# Patient Record
Sex: Male | Born: 1990 | Race: White | Hispanic: No | Marital: Married | Smoking: Never smoker
Health system: Southern US, Community
[De-identification: ages and names within clinical notes are randomized; demographics above are authoritative.]

## PROBLEM LIST (undated history)

## (undated) DIAGNOSIS — F909 Attention-deficit hyperactivity disorder, unspecified type: Secondary | ICD-10-CM

## (undated) DIAGNOSIS — R480 Dyslexia and alexia: Secondary | ICD-10-CM

## (undated) DIAGNOSIS — M352 Behcet's disease: Secondary | ICD-10-CM

## (undated) HISTORY — PX: HAND SURGERY: SHX662

## (undated) HISTORY — DX: Behcet's disease: M35.2

---
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
## 2011-11-21 ENCOUNTER — Encounter (HOSPITAL_BASED_OUTPATIENT_CLINIC_OR_DEPARTMENT_OTHER): Payer: Self-pay | Admitting: *Deleted

## 2011-11-21 ENCOUNTER — Emergency Department (HOSPITAL_BASED_OUTPATIENT_CLINIC_OR_DEPARTMENT_OTHER)
Admission: EM | Admit: 2011-11-21 | Discharge: 2011-11-21 | Disposition: A | Discharge status: Home / Self Care | Payer: Self-pay | Attending: Emergency Medicine | Admitting: Emergency Medicine

## 2011-11-21 ENCOUNTER — Emergency Department (HOSPITAL_BASED_OUTPATIENT_CLINIC_OR_DEPARTMENT_OTHER): Payer: Self-pay

## 2011-11-21 DIAGNOSIS — F909 Attention-deficit hyperactivity disorder, unspecified type: Secondary | ICD-10-CM | POA: Insufficient documentation

## 2011-11-21 DIAGNOSIS — S9031XA Contusion of right foot, initial encounter: Secondary | ICD-10-CM

## 2011-11-21 DIAGNOSIS — S9030XA Contusion of unspecified foot, initial encounter: Secondary | ICD-10-CM | POA: Insufficient documentation

## 2011-11-21 DIAGNOSIS — IMO0001 Reserved for inherently not codable concepts without codable children: Secondary | ICD-10-CM | POA: Insufficient documentation

## 2011-11-21 DIAGNOSIS — R480 Dyslexia and alexia: Secondary | ICD-10-CM | POA: Insufficient documentation

## 2011-11-21 DIAGNOSIS — Y92009 Unspecified place in unspecified non-institutional (private) residence as the place of occurrence of the external cause: Secondary | ICD-10-CM | POA: Insufficient documentation

## 2011-11-21 DIAGNOSIS — Z881 Allergy status to other antibiotic agents status: Secondary | ICD-10-CM | POA: Insufficient documentation

## 2011-11-21 HISTORY — DX: Dyslexia and alexia: R48.0

## 2011-11-21 HISTORY — DX: Attention-deficit hyperactivity disorder, unspecified type: F90.9
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
## 2011-11-21 NOTE — Discharge Instructions (Signed)
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
## 2011-11-22 NOTE — ED Provider Notes (Signed)
Medical screening examination/treatment/procedure(s) were performed by non-physician practitioner and as supervising physician I was immediately available for consultation/collaboration.   Hanley Seamen, MD 11/22/11 [phone]

## 2011-11-30 ENCOUNTER — Encounter (HOSPITAL_BASED_OUTPATIENT_CLINIC_OR_DEPARTMENT_OTHER): Payer: Self-pay

## 2011-11-30 ENCOUNTER — Emergency Department (HOSPITAL_BASED_OUTPATIENT_CLINIC_OR_DEPARTMENT_OTHER)
Admission: EM | Admit: 2011-11-30 | Discharge: 2011-11-30 | Disposition: A | Discharge status: Home / Self Care | Payer: No Typology Code available for payment source | Attending: Emergency Medicine | Admitting: Emergency Medicine

## 2011-11-30 DIAGNOSIS — S139XXA Sprain of joints and ligaments of unspecified parts of neck, initial encounter: Secondary | ICD-10-CM | POA: Insufficient documentation

## 2011-11-30 DIAGNOSIS — F909 Attention-deficit hyperactivity disorder, unspecified type: Secondary | ICD-10-CM | POA: Insufficient documentation

## 2011-11-30 DIAGNOSIS — S161XXA Strain of muscle, fascia and tendon at neck level, initial encounter: Secondary | ICD-10-CM

## 2011-11-30 DIAGNOSIS — Y92410 Unspecified street and highway as the place of occurrence of the external cause: Secondary | ICD-10-CM | POA: Insufficient documentation
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
## 2011-11-30 NOTE — ED Notes (Signed)
MD at bedside. 

## 2014-09-14 ENCOUNTER — Emergency Department (HOSPITAL_COMMUNITY): Payer: PRIVATE HEALTH INSURANCE

## 2014-09-14 ENCOUNTER — Emergency Department (HOSPITAL_COMMUNITY)
Admission: EM | Admit: 2014-09-14 | Discharge: 2014-09-14 | Disposition: A | Discharge status: Home / Self Care | Payer: PRIVATE HEALTH INSURANCE | Attending: Emergency Medicine | Admitting: Emergency Medicine

## 2014-09-14 ENCOUNTER — Encounter (HOSPITAL_COMMUNITY): Payer: Self-pay | Admitting: Emergency Medicine

## 2014-09-14 DIAGNOSIS — W2107XA Struck by softball, initial encounter: Secondary | ICD-10-CM | POA: Insufficient documentation

## 2014-09-14 DIAGNOSIS — Y9364 Activity, baseball: Secondary | ICD-10-CM | POA: Insufficient documentation

## 2014-09-14 DIAGNOSIS — Z880 Allergy status to penicillin: Secondary | ICD-10-CM | POA: Diagnosis not present

## 2014-09-14 DIAGNOSIS — S4991XA Unspecified injury of right shoulder and upper arm, initial encounter: Secondary | ICD-10-CM | POA: Diagnosis present

## 2014-09-14 DIAGNOSIS — Y9289 Other specified places as the place of occurrence of the external cause: Secondary | ICD-10-CM | POA: Insufficient documentation

## 2014-09-14 DIAGNOSIS — Y998 Other external cause status: Secondary | ICD-10-CM | POA: Diagnosis not present

## 2014-09-14 DIAGNOSIS — S43401A Unspecified sprain of right shoulder joint, initial encounter: Secondary | ICD-10-CM | POA: Diagnosis not present

## 2014-09-14 DIAGNOSIS — Z8659 Personal history of other mental and behavioral disorders: Secondary | ICD-10-CM | POA: Insufficient documentation

## 2014-09-14 MED ORDER — OXYCODONE-ACETAMINOPHEN 5-325 MG PO TABS
1.0000 | ORAL_TABLET | Freq: Once | ORAL | Status: AC
  Administered 2014-09-14: 1 via ORAL
  Filled 2014-09-14: qty 1

## 2014-09-14 MED ORDER — IBUPROFEN 800 MG PO TABS: 800.0000 mg | ORAL_TABLET | Freq: Three times a day (TID) | ORAL | Status: DC

## 2014-09-14 NOTE — Discharge Instructions (Signed)
Shoulder Sprain °A shoulder sprain is the result of damage to the tough, fiber-like tissues (ligaments) that help hold your shoulder in place. The ligaments may be stretched or torn. Besides the main shoulder joint (the ball and socket), there are several smaller joints that connect the bones in this area. A sprain usually involves one of those joints. Most often it is the acromioclavicular (or AC) joint. That is the joint that connects the collarbone (clavicle) and the shoulder blade (scapula) at the top point of the shoulder blade (acromion). °A shoulder sprain is a mild form of what is called a shoulder separation. Recovering from a shoulder sprain may take some time. For some, pain lingers for several months. Most people recover without long term problems. °CAUSES  °· A shoulder sprain is usually caused by some kind of trauma. This might be: °¨ Falling on an outstretched arm. °¨ Being hit hard on the shoulder. °¨ Twisting the arm. °· Shoulder sprains are more likely to occur in people who: °¨ Play sports. °¨ Have balance or coordination problems. °SYMPTOMS  °· Pain when you move your shoulder. °· Limited ability to move the shoulder. °· Swelling and tenderness on top of the shoulder. °· Redness or warmth in the shoulder. °· Bruising. °· A change in the shape of the shoulder. °DIAGNOSIS  °Your healthcare provider may: °· Ask about your symptoms. °· Ask about recent activity that might have caused those symptoms. °· Examine your shoulder. You may be asked to do simple exercises to test movement. The other shoulder will be examined for comparison. °· Order some tests that provide a look inside the body. They can show the extent of the injury. The tests could include: °¨ X-rays. °¨ CT (computed tomography) scan. °¨ MRI (magnetic resonance imaging) scan. °RISKS AND COMPLICATIONS °· Loss of full shoulder motion. °· Ongoing shoulder pain. °TREATMENT  °How long it takes to recover from a shoulder sprain depends on how  severe it was. Treatment options may include: °· Rest. You should not use the arm or shoulder until it heals. °· Ice. For 2 or 3 days after the injury, put an ice pack on the shoulder up to 4 times a day. It should stay on for 15 to 20 minutes each time. Wrap the ice in a towel so it does not touch your skin. °· Over-the-counter medicine to relieve pain. °· A sling or brace. This will keep the arm still while the shoulder is healing. °· Physical therapy or rehabilitation exercises. These will help you regain strength and motion. Ask your healthcare provider when it is OK to begin these exercises. °· Surgery. The need for surgery is rare with a sprained shoulder, but some people may need surgery to keep the joint in place and reduce pain. °HOME CARE INSTRUCTIONS  °· Ask your healthcare provider about what you should and should not do while your shoulder heals. °· Make sure you know how to apply ice to the correct area of your shoulder. °· Talk with your healthcare provider about which medications should be used for pain and swelling. °· If rehabilitation therapy will be needed, ask your healthcare provider to refer you to a therapist. If it is not recommended, then ask about at-home exercises. Find out when exercise should begin. °SEEK MEDICAL CARE IF:  °Your pain, swelling, or redness at the joint increases. °SEEK IMMEDIATE MEDICAL CARE IF:  °· You have a fever. °· You cannot move your arm or shoulder. °Document Released: 10/03/2008 Document   Revised: 08/09/2011 Document Reviewed: 10/03/2008 °[employer]® Patient Information ©2015 [employer]. This information is not intended to replace advice given to you by your health care provider. Make sure you discuss any questions you have with your health care provider. ° °

## 2014-09-14 NOTE — ED Provider Notes (Signed)
CSN: [id number]     Arrival date & time 09/14/14  1814 History   First MD Initiated Contact with Patient 09/14/14 1831     Chief Complaint  Patient presents with  . Shoulder Injury     (Consider location/radiation/quality/duration/timing/severity/associated sxs/prior Treatment) HPI   24 year old male presents for evaluation of R shoulder injury. Patient reports approximately 2 hours ago he was playing softball, he went to catch the ball but ran into a fence, knock the fence down and landed directly on his right shoulder. He denies any loss of consciousness but complained of severe sharp pain to right shoulder, unable to move shoulder. Pain radiates to his right upper arm, persistent, worsening with movement. No associated numbness, no neck pain, elbow pain or wrist pain. Reported remote injury to the right shoulder in the past. Denies any other injury. No specific treatment tried prior to arrival.  Past Medical History  Diagnosis Date  . ADHD (attention deficit hyperactivity disorder)   . Dyslexia    History reviewed. No pertinent past surgical history. No family history on file. History  Substance Use Topics  . Smoking status: Never Smoker   . Smokeless tobacco: Not on file  . Alcohol Use: Yes    Review of Systems  Constitutional: Negative for fever.  Musculoskeletal: Positive for joint swelling.  Skin: Negative for rash and wound.  Neurological: Negative for numbness.      Allergies  Amoxicillin  Home Medications   Prior to Admission medications   Medication Sig Start Date End Date Taking? Authorizing Provider  ibuprofen (ADVIL,MOTRIN) 200 MG tablet Take 600 mg by mouth every 6 (six) hours as needed for moderate pain. Patient used this medication for his back pain.   Yes Historical Provider, MD  Pseudoephedrine-Ibuprofen (ADVIL COLD/SINUS PO) Take 1 tablet by mouth every 6 (six) hours as needed (cold symptoms and sinus pressure).   Yes Historical Provider, MD   BP  120/76 mmHg  Pulse 104  Temp(Src) 98.5 F (36.9 C) (Oral)  Resp 19  SpO2 98% Physical Exam  Constitutional: He appears well-developed and well-nourished. No distress.  HENT:  Head: Atraumatic.  Eyes: Conjunctivae are normal.  Neck: Normal range of motion. Neck supple.  Musculoskeletal: He exhibits tenderness (Right shoulder: [facility] with surrounding edema.  Decreased shoulder ROM due to pain.  no clavicular tenderness.  No gross deformity to shoulder.  normal R elbow and R wrist with intact radial pulse and sensation intact throughout).  No midline spine tenderness.  Neurological: He is alert.  Skin: No rash noted.  Psychiatric: He has a normal mood and affect.  Nursing note and vitals reviewed.   ED Course  Procedures (including critical care time)  7:08 PM Patient with mechanical injury to right shoulder, suspect dislocation vs sprain. X-ray ordered, pain medication given. Patient otherwise neurovascularly intact.  8:29 PM X-ray of right shoulder shows no acute fracture or dislocation. This is likely a shoulder sprain. Shoulder sling provided for stability and support. Rice therapy discussed. NSAIDs is provided. Orthopedic referral given as needed.  Labs Review Labs Reviewed - No data to display  Imaging Review Dg Shoulder Right  09/14/2014   CLINICAL DATA:  Right shoulder pain following a softball injury today.  EXAM: RIGHT SHOULDER - 2+ VIEW  COMPARISON:  None.  FINDINGS: Possible Hill-Sachs deformity in the right humeral head laterally. Otherwise, normal appearing bones and soft tissues. No acute fracture or dislocation seen.  IMPRESSION: Possible Hill-Sachs deformity.  Otherwise, normal examination.  Electronically Signed   By: Beckie Salts M.D.   On: 09/14/2014 19:37     EKG Interpretation None      MDM   Final diagnoses:  Shoulder sprain, right, initial encounter   BP 120/76 mmHg  Pulse 104  Temp(Src) 98.5 F (36.9 C) (Oral)  Resp 19   SpO2 98%  I have reviewed nursing notes and vital signs. I personally reviewed the imaging tests through PACS system  I reviewed available ER/hospitalization records thought the EMR     Fayrene Helper, PA-C 09/14/14 2030  Rolan Bucco, MD 09/15/14 [phone]

## 2014-09-14 NOTE — ED Notes (Signed)
Pt playing softball and fell on shoulder. Patient guarding shoulder. No obvious deformity. Patient able to wiggle fingers and good radial pulses.

## 2015-12-28 ENCOUNTER — Ambulatory Visit (HOSPITAL_COMMUNITY): Payer: Worker's Compensation

## 2015-12-28 ENCOUNTER — Ambulatory Visit (HOSPITAL_COMMUNITY)
Admission: EM | Admit: 2015-12-28 | Discharge: 2015-12-28 | Disposition: A | Discharge status: Home / Self Care | Payer: Worker's Compensation | Attending: Emergency Medicine | Admitting: Emergency Medicine

## 2015-12-28 ENCOUNTER — Encounter (HOSPITAL_COMMUNITY): Payer: Self-pay | Admitting: *Deleted

## 2015-12-28 DIAGNOSIS — X58XXXA Exposure to other specified factors, initial encounter: Secondary | ICD-10-CM | POA: Diagnosis not present

## 2015-12-28 DIAGNOSIS — S6992XA Unspecified injury of left wrist, hand and finger(s), initial encounter: Secondary | ICD-10-CM

## 2015-12-28 DIAGNOSIS — S6982XA Other specified injuries of left wrist, hand and finger(s), initial encounter: Secondary | ICD-10-CM | POA: Diagnosis not present

## 2015-12-28 DIAGNOSIS — Z23 Encounter for immunization: Secondary | ICD-10-CM

## 2015-12-28 MED ORDER — HYDROCODONE-ACETAMINOPHEN 5-325 MG PO TABS: 2.0000 | ORAL_TABLET | Freq: Once | ORAL | Status: DC

## 2015-12-28 MED ORDER — TETANUS-DIPHTH-ACELL PERTUSSIS 5-2.5-18.5 LF-MCG/0.5 IM SUSP
INTRAMUSCULAR | Status: AC
  Filled 2015-12-28: qty 0.5

## 2015-12-28 MED ORDER — MUPIROCIN 2 % EX OINT: 1.0000 "application " | TOPICAL_OINTMENT | Freq: Two times a day (BID) | CUTANEOUS | 0 refills | Status: DC

## 2015-12-28 MED ORDER — TETANUS-DIPHTHERIA TOXOIDS TD 5-2 LFU IM INJ
0.5000 mL | INJECTION | Freq: Once | INTRAMUSCULAR | Status: AC
  Administered 2015-12-28: 0.5 mL via INTRAMUSCULAR

## 2015-12-28 MED ORDER — HYDROCODONE-ACETAMINOPHEN 5-325 MG PO TABS: 2.0000 | ORAL_TABLET | ORAL | 0 refills | Status: DC | PRN

## 2015-12-28 MED ORDER — LIDOCAINE HCL (PF) 2 % IJ SOLN
INTRAMUSCULAR | Status: AC
  Filled 2015-12-28: qty 2

## 2015-12-28 MED ORDER — IBUPROFEN 800 MG PO TABS
ORAL_TABLET | ORAL | Status: AC
  Filled 2015-12-28: qty 1

## 2015-12-28 MED ORDER — IBUPROFEN 800 MG PO TABS
800.0000 mg | ORAL_TABLET | Freq: Once | ORAL | Status: AC
  Administered 2015-12-28: 800 mg via ORAL

## 2015-12-28 NOTE — ED Triage Notes (Signed)
Patient was at work yesterday, [employer], and the drill went into finger causing nail bed to be lifted. On exam mild bleeding noted to nail bed with bruising and swelling. Injury to left pointer finger.

## 2015-12-28 NOTE — ED Provider Notes (Signed)
CSN: [id number]     Arrival date & time 12/28/15  1206 History   First MD Initiated Contact with Patient 12/28/15 1303     Chief Complaint  Patient presents with  . Finger Injury  . Nail Problem   (Consider location/radiation/quality/duration/timing/severity/associated sxs/prior Treatment)  HPI   The patient is a 25 year old male presenting today with complaints of an injury to his left index finger yesterday morning at work. Patient states the drill accidentally went up underneath his nail. Patient is unsure of his tetanus status and states he is allergic to amoxicillin or penicillin. Denies significant medical history otherwise.  Past Medical History:  Diagnosis Date  . ADHD (attention deficit hyperactivity disorder)   . Dyslexia    History reviewed. No pertinent surgical history. History reviewed. No pertinent family history. Social History  Substance Use Topics  . Smoking status: Never Smoker  . Smokeless tobacco: Never Used  . Alcohol use Yes    Review of Systems  Constitutional: Negative.   HENT: Negative.   Eyes: Negative.   Respiratory: Negative.   Cardiovascular: Negative.   Gastrointestinal: Negative.   Endocrine: Negative.   Genitourinary: Negative.   Musculoskeletal: Negative.   Skin: Positive for wound.       Injury to nailbed of left pointer finger with a drill yesterday morning.    Allergic/Immunologic: Negative.   Neurological: Negative.   Hematological: Negative.   Psychiatric/Behavioral: Negative.     Allergies  Amoxicillin  Home Medications   Prior to Admission medications   Medication Sig Start Date End Date Taking? Authorizing Provider  HYDROcodone-acetaminophen (NORCO/VICODIN) 5-325 MG tablet Take 2 tablets by mouth every 4 (four) hours as needed. 12/28/15   Servando Salina, NP  ibuprofen (ADVIL,MOTRIN) 800 MG tablet Take 1 tablet (800 mg total) by mouth 3 (three) times daily. 09/14/14   Fayrene Helper, PA-C  mupirocin ointment (BACTROBAN) 2 %  Place 1 application into the nose 2 (two) times daily. 12/28/15   Servando Salina, NP  Pseudoephedrine-Ibuprofen (ADVIL COLD/SINUS PO) Take 1 tablet by mouth every 6 (six) hours as needed (cold symptoms and sinus pressure).    Historical Provider, MD   Meds Ordered and Administered this Visit   Medications  ibuprofen (ADVIL,MOTRIN) tablet 800 mg (800 mg Oral Given 12/28/15 1446)  tetanus & diphtheria toxoids (adult) (TENIVAC) injection 0.5 mL (0.5 mLs Intramuscular Given 12/28/15 1617)    BP 128/78 (BP Location: Right Arm)   Pulse (!) 53   Temp 97.8 F (36.6 C) (Oral)   Resp 16   SpO2 100%  No data found.   Physical Exam  Constitutional: He appears well-developed and well-nourished.  Cardiovascular: Normal rate and regular rhythm.   No murmur heard. Pulmonary/Chest: Effort normal and breath sounds normal. No respiratory distress.  Abdominal: There is no tenderness.  Musculoskeletal: He exhibits tenderness. He exhibits no edema or deformity.  Neurological: He is alert.  Skin: Skin is warm and dry. Capillary refill takes less than 2 seconds.  Nail is partially lifted up from nailbed and oozing bloody drainage.    Nursing note and vitals reviewed.   Urgent Care Course   Clinical Course    .Nail Removal Date/Time: 12/28/2015 2:28 PM Performed by: Servando Salina Authorized by: Charm Rings   Consent:    Consent obtained:  Verbal   Consent given by:  Patient   Risks discussed:  Pain and permanent nail deformity   Alternatives discussed:  No treatment Location:    Hand:  L  index finger Pre-procedure details:    Skin preparation:  Betadine and alcohol   Preparation: Patient was prepped and draped in the usual sterile fashion   Anesthesia (see MAR for exact dosages):    Anesthesia method:  Nerve block   Block needle gauge:  27 G   Block anesthetic:  Lidocaine 2% w/o epi   Block technique:  Digital nerve block   Block injection procedure:  Incremental injection and  anatomic landmarks palpated   Block outcome:  Anesthesia achieved Nail Removal:    Nail removed:  Complete   Nail bed repaired: no   Ingrown nail:    Nail matrix removed or ablated:  None Post-procedure details:    Dressing:  Xeroform gauze   Patient tolerance of procedure:  Tolerated well, no immediate complications Comments:     Small bleeding laceration approximately 3 mm noted in nailbed. Possible open fracture.  Nailbed soaked and then dressed in xeroform gauze.  Too late to suture.  Sent to xray to r/o fracture. Advised he would likely have a ridge in fingernail with regrowth.     (including critical care time)  Labs Review Labs Reviewed - No data to display  Imaging Review Dg Finger Index Left  Result Date: 12/28/2015 CLINICAL DATA:  Distal left index finger pain, injury with drill bit. EXAM: LEFT INDEX FINGER 2+V COMPARISON:  For through 08/17/2009 for FINDINGS: There is no evidence of fracture or dislocation. There is no evidence of arthropathy or other focal bone abnormality. Soft tissues are unremarkable. IMPRESSION: Negative. Electronically Signed   By: Charlett Nose M.D.   On: 12/28/2015 15:15   [facility] assisted with nail removal.    MDM   1. Fingernail injury, left, initial encounter    Meds ordered this encounter  Medications  . DISCONTD: HYDROcodone-acetaminophen (NORCO/VICODIN) 5-325 MG per tablet 2 tablet  . ibuprofen (ADVIL,MOTRIN) tablet 800 mg  . HYDROcodone-acetaminophen (NORCO/VICODIN) 5-325 MG tablet    Sig: Take 2 tablets by mouth every 4 (four) hours as needed.    Dispense:  15 tablet    Refill:  0  . tetanus & diphtheria toxoids (adult) (TENIVAC) injection 0.5 mL  . mupirocin ointment (BACTROBAN) 2 %    Sig: Place 1 application into the nose 2 (two) times daily.    Dispense:  22 g    Refill:  0    Plan of care was discussed with Dr. Piedad Climes. Nonstick dressing and mupirocin ointment for infection prevention discussed signs and symptoms of  infection with patient patient to return here for difficulties reevaluation as necessary. Ibuprofen as needed for regular discomfort Vicodin for severe discomfort.  The patient verbalizes understanding and agrees to plan of care.       Servando Salina, NP 12/28/15 1730    Servando Salina, NP 12/28/15 1730

## 2015-12-28 NOTE — ED Notes (Signed)
Patient transported to X-ray by [facility] shuttle 

## 2015-12-28 NOTE — ED Notes (Signed)
Patient returned from xray by [facility] Shuttle

## 2015-12-28 NOTE — Discharge Instructions (Signed)
Keep covered for comfort.  Use prescribed mupirocin ointment to keep moist and prevent infection.  Use non-stick bandages.

## 2016-08-31 ENCOUNTER — Ambulatory Visit (INDEPENDENT_AMBULATORY_CARE_PROVIDER_SITE_OTHER): Payer: PRIVATE HEALTH INSURANCE | Admitting: Orthopaedic Surgery

## 2016-08-31 ENCOUNTER — Ambulatory Visit (INDEPENDENT_AMBULATORY_CARE_PROVIDER_SITE_OTHER): Payer: PRIVATE HEALTH INSURANCE

## 2016-08-31 ENCOUNTER — Encounter (INDEPENDENT_AMBULATORY_CARE_PROVIDER_SITE_OTHER): Payer: Self-pay | Admitting: Orthopaedic Surgery

## 2016-08-31 VITALS — BP 141/96 | HR 63 | Resp 14 | Ht 75.0 in | Wt 188.0 lb

## 2016-08-31 DIAGNOSIS — M7541 Impingement syndrome of right shoulder: Secondary | ICD-10-CM | POA: Diagnosis not present

## 2016-08-31 DIAGNOSIS — M25511 Pain in right shoulder: Secondary | ICD-10-CM

## 2016-08-31 DIAGNOSIS — G8929 Other chronic pain: Secondary | ICD-10-CM

## 2016-08-31 MED ORDER — METHYLPREDNISOLONE ACETATE 40 MG/ML IJ SUSP
80.0000 mg | INTRAMUSCULAR | Status: AC | PRN
  Administered 2016-08-31: 80 mg

## 2016-08-31 MED ORDER — LIDOCAINE HCL 1 % IJ SOLN
2.0000 mL | INTRAMUSCULAR | Status: AC | PRN
  Administered 2016-08-31: 2 mL

## 2016-08-31 MED ORDER — BUPIVACAINE HCL 0.5 % IJ SOLN
2.0000 mL | INTRAMUSCULAR | Status: AC | PRN
  Administered 2016-08-31: 2 mL via INTRA_ARTICULAR

## 2016-08-31 NOTE — Progress Notes (Signed)
Office Visit Note   Patient: Cristian Weiss           Date of Birth: 25-Mar-1991           MRN: [id number] Visit Date: 08/31/2016              Requested by: No referring provider defined for this encounter. PCP: No PCP Per Patient   Assessment & Plan: Visit Diagnoses:  1. Chronic right shoulder pain   2. Impingement syndrome of right shoulder     Plan:  #1: Subacromial injection was given to him the right arm. He had some benefit but still had significant discomfort with range of motion. #2: Since he said a limited response R plan is to proceed with a MRI scan of the right shoulder with contrast arthrogram to rule out labral tear.  Follow-Up Instructions: Return in about 2 weeks (around 09/14/2016).   Orders:  Orders Placed This Encounter  Procedures  . Large Joint Injection/Arthrocentesis  . XR Shoulder Right   No orders of the defined types were placed in this encounter.     Procedures: Large Joint Inj Date/Time: 08/31/2016 4:55 PM Performed by: Jacqualine Code D Authorized by: Jacqualine Code D   Consent Given by:  Patient Timeout: prior to procedure the correct patient, procedure, and site was verified   Indications:  Pain Location:  Shoulder Site:  R subacromial bursa Prep: patient was prepped and draped in usual sterile fashion   Needle Size:  25 G Needle Length:  1.5 inches Approach:  Lateral Ultrasound Guidance: No   Fluoroscopic Guidance: No   Arthrogram: No   Medications:  80 mg methylPREDNISolone acetate 40 MG/ML; 2 mL lidocaine 1 %; 2 mL bupivacaine 0.5 % Aspiration Attempted: No   Patient tolerance:  Patient tolerated the procedure well with no immediate complications     Clinical Data: No additional findings.   Subjective: Chief Complaint  Patient presents with  . Right Shoulder - Pain, Edema, Numbness    Cristian Weiss is a 26 year old male that presents with chronic right shoulder pain. He relates he played baseball for years and now he  has limited overhead ROM with numbness and tingling through the right elbow. He also has clicking and grinding with certain movements.    Review of Systems  Constitutional: Negative.   HENT: Negative.   Respiratory: Negative.   Cardiovascular: Negative.   Gastrointestinal: Negative.   Genitourinary: Negative.   Skin: Negative.   Neurological: Negative.   Hematological: Negative.   Psychiatric/Behavioral: Negative.      Objective: Vital Signs: BP (!) 141/96   Pulse 63   Resp 14   Ht  (1.905 m)   Wt 188 lb (85.3 kg)   BMI 23.50 kg/m   Physical Exam  Constitutional: He is oriented to person, place, and time. He appears well-developed and well-nourished.  HENT:  Head: Normocephalic and atraumatic.  Eyes: EOM are normal. Pupils are equal, round, and reactive to light.  Pulmonary/Chest: Effort normal.  Neurological: He is alert and oriented to person, place, and time.  Skin: Skin is warm and dry.  Psychiatric: He has a normal mood and affect. His behavior is normal. Judgment and thought content normal.    Right Shoulder Exam   Range of Motion  Active Abduction: 90  Passive Abduction: 130  Forward Flexion: 150  External Rotation: 80  Internal Rotation 90 degrees: 60   Muscle Strength  Abduction: 4/5  Internal Rotation: 4/5  External Rotation:  4/5  Supraspinatus: 4/5   Tests  Apprehension: positive Impingement: positive  Other  Sensation: normal Pulse: present      Specialty Comments:  No specialty comments available.  Imaging: Xr Shoulder Right  Result Date: 08/31/2016 Three-view x-rays of the right shoulder reveals some indentation along the greater tuberosity. Type 1-2 acromion. Also some inferior spurring at the glenoid.    PMFS History: There are no active problems to display for this patient.  Past Medical History:  Diagnosis Date  . ADHD (attention deficit hyperactivity disorder)   . Dyslexia     No family history on file.  No  past surgical history on file. Social History   Occupational History  . Not on file.   Social History Main Topics  . Smoking status: Never Smoker  . Smokeless tobacco: Current User    Types: Snuff  . Alcohol use Yes  . Drug use: No  . Sexual activity: Not on file

## 2016-09-01 ENCOUNTER — Other Ambulatory Visit (INDEPENDENT_AMBULATORY_CARE_PROVIDER_SITE_OTHER): Payer: Self-pay | Admitting: *Deleted

## 2016-09-01 DIAGNOSIS — G8929 Other chronic pain: Secondary | ICD-10-CM

## 2016-09-01 DIAGNOSIS — M25511 Pain in right shoulder: Secondary | ICD-10-CM

## 2016-09-01 DIAGNOSIS — M7541 Impingement syndrome of right shoulder: Secondary | ICD-10-CM

## 2016-09-14 ENCOUNTER — Ambulatory Visit
Admission: RE | Admit: 2016-09-14 | Discharge: 2016-09-14 | Disposition: A | Discharge status: Home / Self Care | Payer: PRIVATE HEALTH INSURANCE | Source: Ambulatory Visit | Attending: Orthopedic Surgery | Admitting: Orthopedic Surgery

## 2016-09-14 DIAGNOSIS — G8929 Other chronic pain: Secondary | ICD-10-CM

## 2016-09-14 DIAGNOSIS — M7541 Impingement syndrome of right shoulder: Secondary | ICD-10-CM

## 2016-09-14 DIAGNOSIS — M25511 Pain in right shoulder: Secondary | ICD-10-CM

## 2016-09-14 MED ORDER — IOPAMIDOL (ISOVUE-M 200) INJECTION 41%
15.0000 mL | Freq: Once | INTRAMUSCULAR | Status: AC
  Administered 2016-09-14: 15 mL via INTRA_ARTICULAR

## 2016-09-20 ENCOUNTER — Ambulatory Visit (INDEPENDENT_AMBULATORY_CARE_PROVIDER_SITE_OTHER): Payer: PRIVATE HEALTH INSURANCE | Admitting: Orthopaedic Surgery

## 2018-05-31 HISTORY — PX: SKIN BIOPSY: SHX1

## 2018-06-17 ENCOUNTER — Ambulatory Visit: Payer: Self-pay | Admitting: Nurse Practitioner

## 2018-06-17 VITALS — BP 120/80 | HR 85 | Temp 98.8°F | Resp 20 | Wt 184.8 lb

## 2018-06-17 DIAGNOSIS — J039 Acute tonsillitis, unspecified: Secondary | ICD-10-CM

## 2018-06-17 DIAGNOSIS — J029 Acute pharyngitis, unspecified: Secondary | ICD-10-CM

## 2018-06-17 LAB — POCT RAPID STREP A (OFFICE): RAPID STREP A SCREEN: NEGATIVE

## 2018-06-17 MED ORDER — CLINDAMYCIN HCL 300 MG PO CAPS: 300.0000 mg | ORAL_CAPSULE | Freq: Three times a day (TID) | ORAL | 0 refills | Status: AC

## 2018-06-17 MED ORDER — PREDNISONE 50 MG PO TABS: ORAL_TABLET | ORAL | 0 refills | Status: AC

## 2018-06-17 MED ORDER — LIDOCAINE VISCOUS HCL 2 % MT SOLN: 5.0000 mL | Freq: Four times a day (QID) | OROMUCOSAL | 0 refills | Status: AC | PRN

## 2018-06-17 NOTE — Progress Notes (Signed)
Subjective:     Cristian SchmidtDerrick Sandt is a 28 y.o. male who presents for evaluation of sore throat. Associated symptoms include dry cough, sore throat and swollen glands. Onset of symptoms was 2 days ago, and have been rapidly worsening since that time.  Patient rates current throat pain 8/10 at present. He is drinking moderate amounts of fluids. He has not had a recent close exposure to someone with proven streptococcal pharyngitis.  The following portions of the patient's history were reviewed and updated as appropriate: allergies, current medications and past medical history.  Review of Systems Constitutional: positive for anorexia and fatigue, negative for chills, fevers, malaise and sweats Eyes: negative Ears, nose, mouth, throat, and face: positive for sore throat and Swollen glands, negative for ear drainage, earaches and hoarseness Respiratory: positive for cough, negative for asthma, chronic bronchitis, dyspnea on exertion, pneumonia, sputum, stridor and wheezing Cardiovascular: negative Gastrointestinal: positive for Decreased appetite, negative for abdominal pain, diarrhea, nausea and vomiting Neurological: negative    Objective:    BP 120/80 (BP Location: Right Arm, Patient Position: Sitting, Cuff Size: Normal)   Pulse 85   Temp 98.8 F (37.1 C) (Oral)   Resp 20   Wt 184 lb 12.8 oz (83.8 kg)   SpO2 97%   BMI 23.10 kg/m   Physical Exam Constitutional:      General: He is not in acute distress.    Comments: Appears uncomfortable due to throat pain  HENT:     Head: Normocephalic.     Right Ear: Tympanic membrane and ear canal normal.     Left Ear: Tympanic membrane and ear canal normal.     Nose: Congestion ( Mild) present.     Mouth/Throat:     Mouth: Mucous membranes are moist.     Pharynx: Uvula midline. Pharyngeal swelling, posterior oropharyngeal erythema ( Severe) and uvula swelling present. No oropharyngeal exudate.     Tonsils: No tonsillar exudate. Swelling: 2+ on  the right. 2+ on the left.     Comments: Breath is malodorous Neck:     Musculoskeletal: Normal range of motion and neck supple. No muscular tenderness.  Cardiovascular:     Rate and Rhythm: Normal rate and regular rhythm.     Heart sounds: Normal heart sounds.  Pulmonary:     Effort: Pulmonary effort is normal.     Breath sounds: Normal breath sounds.  Abdominal:     General: Abdomen is flat. There is no distension.     Tenderness: There is no abdominal tenderness.  Lymphadenopathy:     Cervical: Cervical adenopathy ( Superficial right cervical adenopathy) present.  Skin:    General: Skin is warm and dry.     Capillary Refill: Capillary refill takes less than 2 seconds.  Neurological:     Mental Status: He is alert.     Laboratory Strep test done. Results:negative.    Assessment:   Acute tonsillitis Plan:   Exam findings, diagnosis etiology and medication use and indications reviewed with patient. Follow- Up and discharge instructions provided. No emergent/urgent issues found on exam.  There is some concern with the severity of the patient's oropharyngeal edema.  Patient has moderate to severe erythema with superficial cervical adenopathy on the right.  Patient's breath is also malodorous.  I am going to go ahead and treat the patient with clindamycin due to his penicillin allergies as I am unable to perform a throat culture.  We will also provide symptomatic treatment for the patient to help with  inflammation to include prednisone, and lidocaine mouthwash for pain.  Directed patient to have a soft diet until his symptoms improve and he is able to swallow without difficulty.  Instructed patient to follow-up with his PCP if symptoms do not improve.  Patient education was provided. Patient verbalized understanding of information provided and agrees with plan of care (POC), all questions answered. The patient is advised to call or return to clinic if condition does not see an improvement  in symptoms, or to seek the care of the closest emergency department if condition worsens with the above plan.

## 2018-06-17 NOTE — Patient Instructions (Signed)
Tonsillitis -Take medication as prescribed. -Ibuprofen or Tylenol for pain, fever, or general discomfort. -Increase fluids. -Get plenty of rest. -Warm saltwater gargles 3-4 times daily for throat pain until symptoms improve. -Soft diet to include potatoes, soup or fish until tonsils turn to baseline. -Follow-up in the emergency department if you develop difficulty swallowing, difficulty breathing, drooling, or other concerns. -Follow-up with your PCP if symptoms do not improve with this treatment regimen.   Tonsillitis is an infection of the throat that causes the tonsils to become red, tender, and swollen. Tonsils are tissues in the back of your throat. Each tonsil has crevices (crypts). Tonsils normally work to protect the body from infection. What are the causes? Sudden (acute) tonsillitis may be caused by a virus or bacteria, including streptococcal bacteria. Long-lasting (chronic) tonsillitis occurs when the crypts of the tonsils become filled with pieces of food and bacteria, which makes it easy for the tonsils to become repeatedly infected. Tonsillitis can be spread from person to person (is contagious). It may be spread by inhaling droplets that are released with coughing or sneezing. You may also come into contact with viruses or bacteria on surfaces, such as cups or utensils. What are the signs or symptoms? Symptoms of this condition include:  A sore throat. This may include trouble swallowing.  White patches on the tonsils.  Swollen tonsils.  Fever.  Headache.  Tiredness.  Loss of appetite.  Snoring during sleep when you did not snore before.  Small, foul-smelling, yellowish-white pieces of material (tonsilloliths) that you occasionally cough up or spit out. These can cause you to have bad breath. How is this diagnosed? This condition is diagnosed with a physical exam. Diagnosis can be confirmed with the results of lab tests, including a throat culture. How is this  treated? Treatment for this condition depends on the cause, but usually focuses on treating the symptoms associated with it. Treatment may include:  Medicines to relieve pain and manage fever.  Steroid medicines to reduce swelling.  Antibiotic medicines if the condition is caused by bacteria. If attacks of tonsillitis are severe and frequent, your health care provider may recommend surgery to remove the tonsils (tonsillectomy). Follow these instructions at home: Medicines  Take over-the-counter and prescription medicines only as told by your health care provider.  If you were prescribed an antibiotic medicine, take it as told by your health care provider. Do not stop taking the antibiotic even if you start to feel better. Eating and drinking  Drink enough fluid to keep your urine clear or pale yellow.  While your throat is sore, eat soft or liquid foods, such as sherbet, soups, or instant breakfast drinks.  Drink warm liquids.  Eat frozen ice pops. General instructions  Rest as much as possible and get plenty of sleep.  Gargle with a salt-water mixture 3-4 times a day or as needed. To make a salt-water mixture, completely dissolve -1 tsp of salt in 1 cup of warm water.  Wash your hands regularly with soap and water. If soap and water are not available, use hand sanitizer.  Do not share cups, bottles, or other utensils until your symptoms have gone away.  Do not smoke. This can help your symptoms and prevent the infection from coming back. If you need help quitting, ask your health care provider.  Keep all follow-up visits as told by your health care provider. This is important. Contact a health care provider if:  You notice large, tender lumps in your neck that  were not there before.  You have a fever that does not go away after 2-3 days.  You develop a rash.  You cough up a green, yellow-brown, or bloody substance.  You cannot swallow liquids or food for 24  hours.  Only one of your tonsils is swollen. Get help right away if:  You develop any new symptoms, such as vomiting, severe headache, stiff neck, chest pain, trouble breathing, or trouble swallowing.  You have severe throat pain along with drooling or voice changes.  You have severe pain that is not controlled with medicines.  You cannot fully open your mouth.  You develop redness, swelling, or severe pain anywhere in your neck. Summary  Tonsillitis is an infection of the throat that causes the tonsils to become red, tender, and swollen.  Tonsillitis may be caused by a virus or bacteria.  Rest as much as possible. Get plenty of sleep. This information is not intended to replace advice given to you by your health care provider. Make sure you discuss any questions you have with your health care provider. Document Released: 02/24/2005 Document Revised: 06/22/2016 Document Reviewed: 06/22/2016 Elsevier Interactive Patient Education  2019 [employer]

## 2018-06-24 IMAGING — XA DG FLUORO GUIDE NDL PLC/BX
2 series · 2 of 2 positions shown · IV contrast (multihance)
Comparison: none

CLINICAL DATA: Chronic right shoulder pain.

EXAM:
RIGHT SHOULDER INJECTION UNDER FLUOROSCOPY
TECHNIQUE: An appropriate skin entrance site was determined. The site was
marked, prepped with Betadine, draped in the usual sterile fashion,
and infiltrated locally with buffered Lidocaine. A 22 gauge spinal
needle was advanced to the superomedial margin of the humeral head
under intermittent fluoroscopy. 1 ml of 1% lidocaine injected
easily. A mixture of 0.05 mL of MultiHance, 5 mL of 1% lidocaine,
and 15 mL of Isovue-M 200 was then used to opacify the right
shoulder capsule. No immediate complication.
FLUOROSCOPY TIME:  Fluoroscopy Time:  1 second
Radiation Exposure Index (if provided by the fluoroscopic device):
3.58 microGray*m^2
Number of Acquired Spot Images: 0

[Series 1: ortho standard · 1 of 1 slices shown (1 of 2)]
[im 1/1]
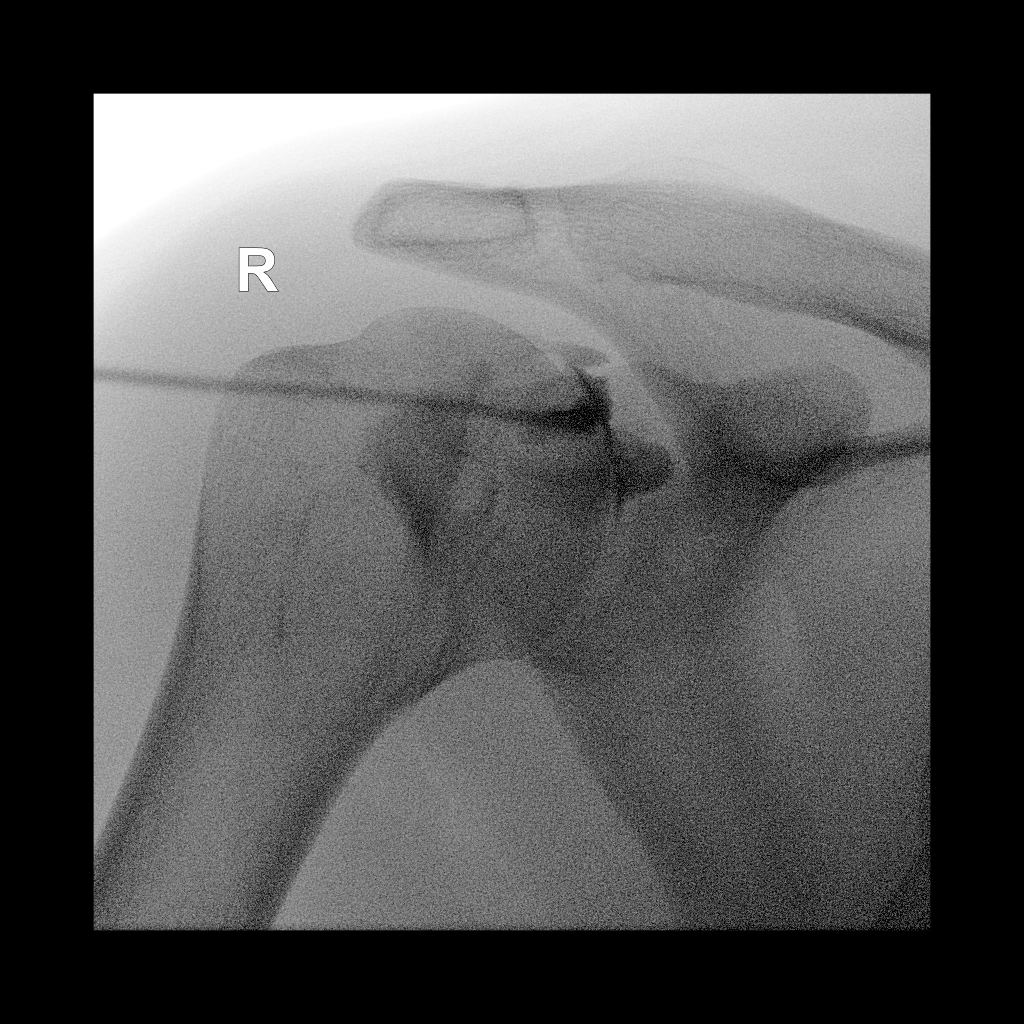

[Series 2: ortho standard · 1 of 1 slices shown (2 of 2)]
[im 1/1]
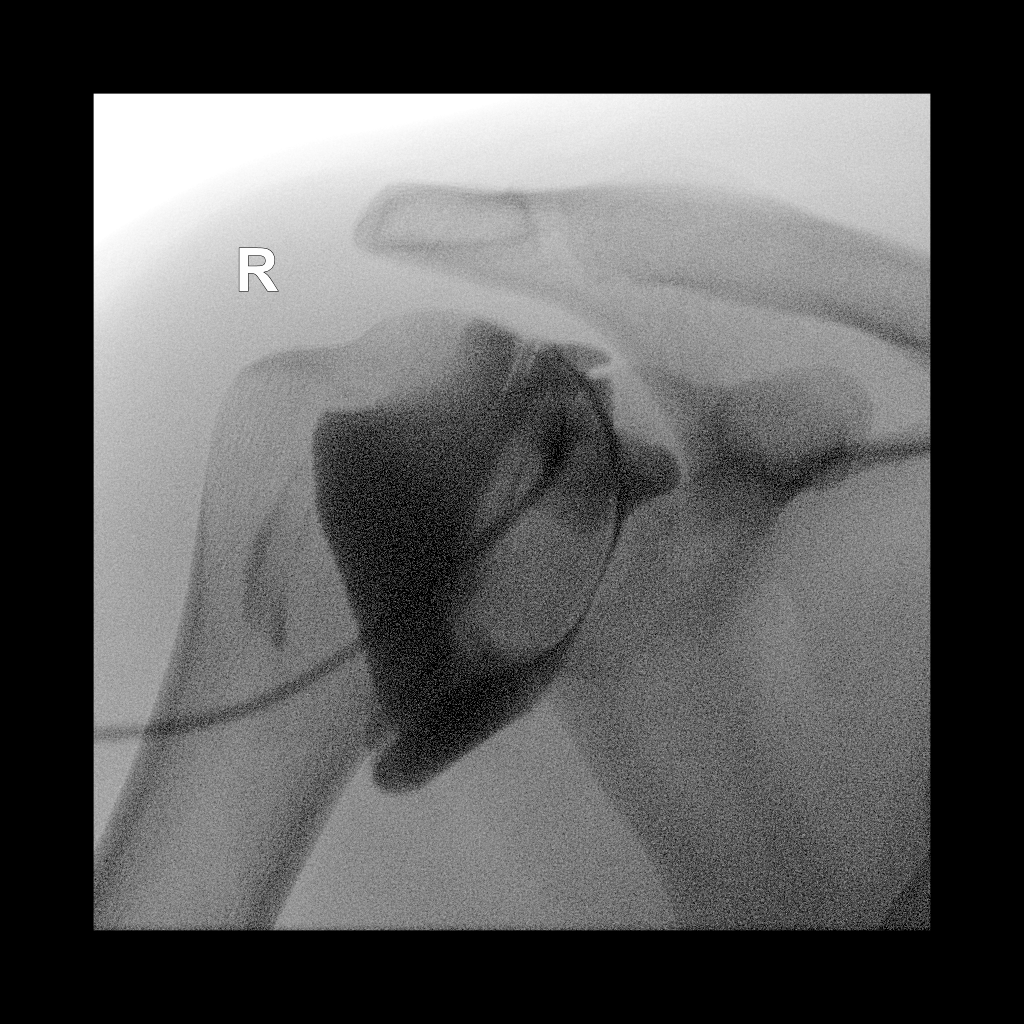

[2 of 2 positions shown; findings below may reference images not displayed]

IMPRESSION: Technically successful right shoulder injection for MRI.

## 2018-07-07 ENCOUNTER — Other Ambulatory Visit: Payer: Self-pay

## 2018-07-07 ENCOUNTER — Inpatient Hospital Stay (HOSPITAL_COMMUNITY)
Admission: EM | Admit: 2018-07-07 | Discharge: 2018-07-10 | DRG: 546 | Disposition: A | Discharge status: Home / Self Care | Payer: BLUE CROSS/BLUE SHIELD | Attending: Internal Medicine | Admitting: Internal Medicine

## 2018-07-07 ENCOUNTER — Emergency Department (HOSPITAL_COMMUNITY): Payer: BLUE CROSS/BLUE SHIELD

## 2018-07-07 ENCOUNTER — Encounter (HOSPITAL_COMMUNITY): Payer: Self-pay | Admitting: Emergency Medicine

## 2018-07-07 DIAGNOSIS — R21 Rash and other nonspecific skin eruption: Secondary | ICD-10-CM | POA: Diagnosis not present

## 2018-07-07 DIAGNOSIS — M352 Behcet's disease: Principal | ICD-10-CM | POA: Diagnosis present

## 2018-07-07 DIAGNOSIS — R509 Fever, unspecified: Secondary | ICD-10-CM | POA: Diagnosis not present

## 2018-07-07 DIAGNOSIS — K121 Other forms of stomatitis: Secondary | ICD-10-CM | POA: Diagnosis present

## 2018-07-07 DIAGNOSIS — L98499 Non-pressure chronic ulcer of skin of other sites with unspecified severity: Secondary | ICD-10-CM | POA: Diagnosis present

## 2018-07-07 DIAGNOSIS — F17220 Nicotine dependence, chewing tobacco, uncomplicated: Secondary | ICD-10-CM | POA: Diagnosis present

## 2018-07-07 DIAGNOSIS — R480 Dyslexia and alexia: Secondary | ICD-10-CM | POA: Diagnosis present

## 2018-07-07 DIAGNOSIS — R6510 Systemic inflammatory response syndrome (SIRS) of non-infectious origin without acute organ dysfunction: Secondary | ICD-10-CM | POA: Diagnosis present

## 2018-07-07 DIAGNOSIS — Z79899 Other long term (current) drug therapy: Secondary | ICD-10-CM

## 2018-07-07 DIAGNOSIS — J029 Acute pharyngitis, unspecified: Secondary | ICD-10-CM | POA: Diagnosis not present

## 2018-07-07 DIAGNOSIS — F909 Attention-deficit hyperactivity disorder, unspecified type: Secondary | ICD-10-CM | POA: Diagnosis present

## 2018-07-07 DIAGNOSIS — Z888 Allergy status to other drugs, medicaments and biological substances status: Secondary | ICD-10-CM

## 2018-07-07 DIAGNOSIS — Z880 Allergy status to penicillin: Secondary | ICD-10-CM

## 2018-07-07 LAB — URINALYSIS, ROUTINE W REFLEX MICROSCOPIC
Bacteria, UA: NONE SEEN
Bilirubin Urine: NEGATIVE
Glucose, UA: NEGATIVE mg/dL
Ketones, ur: 20 mg/dL — AB
LEUKOCYTES UA: NEGATIVE
Nitrite: NEGATIVE
PROTEIN: NEGATIVE mg/dL
Specific Gravity, Urine: 1.023 (ref 1.005–1.030)
pH: 6 (ref 5.0–8.0)

## 2018-07-07 LAB — CBC WITH DIFFERENTIAL/PLATELET
Abs Immature Granulocytes: 0.04 10*3/uL (ref 0.00–0.07)
Basophils Absolute: 0 10*3/uL (ref 0.0–0.1)
Basophils Relative: 0 %
Eosinophils Absolute: 0 10*3/uL (ref 0.0–0.5)
Eosinophils Relative: 0 %
HCT: 47.2 % (ref 39.0–52.0)
Hemoglobin: 15.7 g/dL (ref 13.0–17.0)
Immature Granulocytes: 0 %
Lymphocytes Relative: 10 %
Lymphs Abs: 1.1 10*3/uL (ref 0.7–4.0)
MCH: 29.5 pg (ref 26.0–34.0)
MCHC: 33.3 g/dL (ref 30.0–36.0)
MCV: 88.6 fL (ref 80.0–100.0)
Monocytes Absolute: 1.4 10*3/uL — ABNORMAL HIGH (ref 0.1–1.0)
Monocytes Relative: 12 %
Neutro Abs: 9.1 10*3/uL — ABNORMAL HIGH (ref 1.7–7.7)
Neutrophils Relative %: 78 %
Platelets: 216 10*3/uL (ref 150–400)
RBC: 5.33 MIL/uL (ref 4.22–5.81)
RDW: 11.9 % (ref 11.5–15.5)
WBC: 11.7 10*3/uL — AB (ref 4.0–10.5)
nRBC: 0 % (ref 0.0–0.2)

## 2018-07-07 LAB — COMPREHENSIVE METABOLIC PANEL
ALT: 27 U/L (ref 0–44)
AST: 19 U/L (ref 15–41)
Albumin: 4 g/dL (ref 3.5–5.0)
Alkaline Phosphatase: 47 U/L (ref 38–126)
Anion gap: 12 (ref 5–15)
BUN: 14 mg/dL (ref 6–20)
CHLORIDE: 100 mmol/L (ref 98–111)
CO2: 25 mmol/L (ref 22–32)
Calcium: 9.2 mg/dL (ref 8.9–10.3)
Creatinine, Ser: 1.03 mg/dL (ref 0.61–1.24)
GFR calc Af Amer: >60 mL/min (ref >60)
Glucose, Bld: 109 mg/dL — ABNORMAL HIGH (ref 70–99)
Potassium: 4 mmol/L (ref 3.5–5.1)
Sodium: 137 mmol/L (ref 135–145)
Total Bilirubin: 1.3 mg/dL — ABNORMAL HIGH (ref 0.3–1.2)
Total Protein: 8 g/dL (ref 6.5–8.1)

## 2018-07-07 LAB — CK: CK TOTAL: 42 U/L — AB (ref 49–397)

## 2018-07-07 LAB — LACTIC ACID, PLASMA: Lactic Acid, Venous: 0.8 mmol/L (ref 0.5–1.9)

## 2018-07-07 MED ORDER — IBUPROFEN 400 MG PO TABS: 600.0000 mg | ORAL_TABLET | Freq: Once | ORAL | Status: DC

## 2018-07-07 MED ORDER — IBUPROFEN 100 MG/5ML PO SUSP
600.0000 mg | Freq: Once | ORAL | Status: AC
  Administered 2018-07-07: 600 mg via ORAL
  Filled 2018-07-07: qty 30

## 2018-07-07 MED ORDER — SODIUM CHLORIDE 0.9 % IV BOLUS
1000.0000 mL | Freq: Once | INTRAVENOUS | Status: AC
  Administered 2018-07-07: 1000 mL via INTRAVENOUS

## 2018-07-07 NOTE — ED Provider Notes (Signed)
[facility] EMERGENCY DEPARTMENT Provider Note   CSN: [id number] Arrival date & time: 07/07/18  1950     History   Chief Complaint Chief Complaint  Patient presents with  . Sore Throat  . Dysphagia  . Rash    HPI Cristian Weiss is a 28 y.o. male.  HPI   Patient is a 28 year old male with a history of ADHD and dyslexia presenting for fevers, sore throat, diffuse myalgias and arthralgias, and rash.  He reports that his initial symptoms began approximately 3 weeks ago.  He has been followed by his primary care provider but appears to be worsening.  Patient reports that when this began 3 weeks ago he had strep swab performed which was negative.  Due to increasing swelling of the back to throat he was prescribed clindamycin.  He reports that he was also prescribed Magic mouthwash and fluconazole due to concerns that his throat could have a fungal infection.  He was then placed on steroids initially 50 mg x 3 days 2 weeks ago, and finished a taper yesterday.  He reports that 3 days ago he began having a maculopapular rash all over his body.  He also reports some ulcerations inside his mouth.  Denies any desquamating lesions, or purpuric lesions.  Patient denies any travel, spending time in wooded areas, tick bites, unprotected sexual activity, or IVDU.  Patient denies headaches or neck stiffness over this course.  He denies any dental pain or recent dental infections.  He reports that his throat is become progressively painful to swallow.  He denies any difficulty breathing.  He reports nonproductive cough over this interval.  Patient reports that the fever began tonight.  He does not recall the last time he had a fever in the course of this illness.  He thinks it may have been 2 weeks ago.  Patient had Monospot testing last week which was negative.  He also was seen by dermatologist today who did a skin biopsy and the results are not available yet.  Past Medical History:    Diagnosis Date  . ADHD (attention deficit hyperactivity disorder)   . Dyslexia     There are no active problems to display for this patient.   History reviewed. No pertinent surgical history.      Home Medications    Prior to Admission medications   Medication Sig Start Date End Date Taking? Authorizing Provider  HYDROcodone-acetaminophen (NORCO/VICODIN) 5-325 MG tablet Take 2 tablets by mouth every 4 (four) hours as needed. Patient not taking: Reported on 08/31/2016 12/28/15   Servando Salina, NP  ibuprofen (ADVIL,MOTRIN) 800 MG tablet Take 1 tablet (800 mg total) by mouth 3 (three) times daily. Patient not taking: Reported on 06/17/2018 09/14/14   Fayrene Helper, PA-C  mupirocin ointment (BACTROBAN) 2 % Place 1 application into the nose 2 (two) times daily. Patient not taking: Reported on 08/31/2016 12/28/15   Servando Salina, NP  Pseudoephedrine-Ibuprofen (ADVIL COLD/SINUS PO) Take 1 tablet by mouth every 6 (six) hours as needed (cold symptoms and sinus pressure).    [provider]    Family History No family history on file.  Social History Social History   Tobacco Use  . Smoking status: Never Smoker  . Smokeless tobacco: Current User    Types: Snuff  Substance Use Topics  . Alcohol use: Yes  . Drug use: No     Allergies   Amoxicillin and Penicillins   Review of Systems Review of Systems  Constitutional: Positive for chills and fever.  HENT: Positive for rhinorrhea, sore throat and trouble swallowing. Negative for congestion, dental problem and voice change.   Eyes: Negative for visual disturbance.  Respiratory: Negative for cough, chest tightness, shortness of breath, wheezing and stridor.   Cardiovascular: Negative for chest pain and palpitations.  Gastrointestinal: Negative for abdominal pain, nausea and vomiting.  Genitourinary: Negative for dysuria and flank pain.  Musculoskeletal: Positive for arthralgias. Negative for back pain and myalgias.   Skin: Positive for rash.  Neurological: Negative for dizziness, syncope, light-headedness and headaches.     Physical Exam Updated Vital Signs BP 127/85 (BP Location: Right Arm)   Pulse 98   Temp (!) 102.3 F (39.1 C) (Oral)   Resp (!) 24   SpO2 99%   Physical Exam Vitals signs and nursing note reviewed.  Constitutional:      General: He is not in acute distress.    Appearance: He is well-developed.  HENT:     Head: Normocephalic and atraumatic.     Mouth/Throat:     Tonsils: Swelling: 2+ on the right. 2+ on the left.     Comments: Normal phonation. No muffled voice sounds. Patient swallows secretions without difficulty. Dentition normal. No lesions of tongue or buccal mucosa. Uvula midline. No asymmetric swelling of the posterior pharynx.Erythema of posterior pharynx. No tonsillar exuduate. No lingual swelling. No induration inferior to tongue. No submandibular tenderness, swelling, or induration.  Tissues of the neck supple. No cervical lymphadenopathy. Right TM without erythema or effusion; left TM without erythema or effusion.  Patient has intraoral ulcers on the soft and hard palate. Eyes:     Conjunctiva/sclera: Conjunctivae normal.     Pupils: Pupils are equal, round, and reactive to light.  Neck:     Musculoskeletal: Normal range of motion and neck supple.     Comments: No meningismus. Cardiovascular:     Rate and Rhythm: Normal rate and regular rhythm.     Heart sounds: S1 normal and S2 normal. No murmur.  Pulmonary:     Effort: Pulmonary effort is normal.     Breath sounds: Normal breath sounds. No wheezing or rales.  Abdominal:     General: There is no distension.     Palpations: Abdomen is soft.     Tenderness: There is no abdominal tenderness. There is no guarding.  Musculoskeletal: Normal range of motion.        General: No deformity.  Lymphadenopathy:     Cervical: No cervical adenopathy.  Skin:    General: Skin is warm and dry.     Findings: Rash  present. No erythema.     Comments: See clinical photo for details.  Patient has a raised, maculopapular eruptions of the thorax, abdomen, face, and bilateral extremities.  They are blanching.  No desquamation. No lesions noted to palms or soles.  Neurological:     Mental Status: He is alert.     Comments: Cranial nerves grossly intact. Patient moves extremities symmetrically and with good coordination.  Psychiatric:        Behavior: Behavior normal.        Thought Content: Thought content normal.        Judgment: Judgment normal.            ED Treatments / Results  Labs (all labs ordered are listed, but only abnormal results are displayed)   EKG None  Radiology Dg Chest 2 View  Result Date: 07/07/2018 CLINICAL DATA:  28 y/o  M; fever, cough, and aches for 3 weeks. EXAM: CHEST - 2 VIEW COMPARISON:  04/09/2016 chest radiograph. FINDINGS: Stable heart size and mediastinal contours are within normal limits. Both lungs are clear. The visualized skeletal structures are unremarkable. IMPRESSION: No acute pulmonary process identified. Electronically Signed   By: Mitzi Hansen M.D.   On: 07/07/2018 21:35    Procedures Procedures (including critical care time)  Medications Ordered in ED Medications  sodium chloride 0.9 % bolus 1,000 mL (1,000 mLs Intravenous New Bag/Given 07/07/18 2133)  ibuprofen (ADVIL,MOTRIN) 100 MG/5ML suspension 600 mg (600 mg Oral Given 07/07/18 2124)     Initial Impression / Assessment and Plan / ED Course  I have reviewed the triage vital signs and the nursing notes.  Pertinent labs & imaging results that were available during my care of the patient were reviewed by me and considered in my medical decision making (see chart for details).  Clinical Course as of Jul 08 21  Caleen Essex Jul 07, 2018  2324 Spoke with Dr. Clyde Lundborg of [facility] hospitalists who will admit patient.  He would like infectious disease consultation prior to admission.  Will consult.   Appreciate his involvement in the care of this patient.   [AM]  Sat Jul 08, 2018  0022 Spoke with Dr. Orvan Falconer of infectious disease who states that he would not start antibiotics at this time if patient is clinically stable.  He will see the patient in the morning to make recommendations.  Appreciate his involvement in the care of this patient.   [AM]    Clinical Course User Index [AM] Elisha Ponder, PA-C    Patient is overall nontoxic-appearing, is febrile to 102.3, and slightly tachypneic, but otherwise non-tachycardic and hemodynamically stable.  Patient with protracted course of fevers, myalgias, sore throat and rash.  He has been seen by dermatology today.  Rashes not consistent with SJS.  Does not appear to be meningococcal.  Does not appear to be consistent with [facility] spotted fever.  Does not appear consistent with Osler's nodes or Janeway lesions of endocarditis.  Lab work overall unremarkable.  Leukocytosis 11.7 however this is in the setting of having recent steroid use.  Normal renal function.  T bili elevated to 1.3.  Normal lactic acid.  Chest x-ray unremarkable.  Urinalysis with small hemoglobin but otherwise unremarkable.  RPR and HIV are pending.  Will consult [facility] hospitalist given concern about disseminated process in the setting of fever of 102.3.    This is a supervised visit with Dr. Loren Racer. Evaluation, management, and discharge planning discussed with this attending physician.  Final Clinical Impressions(s) / ED Diagnoses   Final diagnoses:  Fever, unspecified fever cause  Rash  Pharyngitis, unspecified etiology    ED Discharge Orders    None       Delia Chimes 07/08/18 Eartha Inch, MD 07/09/18 1536

## 2018-07-07 NOTE — ED Triage Notes (Addendum)
C/o sore throat and difficulty swallowing x 3 weeks.  States he has been seen for same and treated with steroids and antibiotics.  Also c/o rash all over that just started.

## 2018-07-07 NOTE — H&P (Signed)
History and Physical    Cristian Weiss WYO:[id number] DOB: August 10, 1990 DOA: 07/07/2018  Referring MD/NP/PA:   PCP: Karlene Einstein, MD   Patient coming from:  The patient is coming from home.  At baseline, pt is independent for most of ADL.        Chief Complaint: fever, rash and body aches  HPI: Cristian Weiss is a 28 y.o. male with medical history significant of ADHA and dyslexia who presents with fever, rash, joint pain and body aches.  Patient states that he has been sick for more than 3 weeks.  Initially he had body aches, sore throat, oral mucous ulcer, diffused muscle pain.  He was seen by primary care doctor.  And had negative strep swab and mononucleosis test. Pt was treated with magic mouthwash, fluconazole, clindamycin and tapering dose of prednisone for 2 weeks.  He just finished prednisone taper yesterday.  He reports that 3 days ago he began having maculopapular painful rash all over his body. He developed fever and diffuse joint pain since yesterday. He was seen by dermatologist today who did a skin biopsy and the results are not available yet. Per his wife who is a Marine scientist, dermatologist did not think patient has SJS. Patient denies any travel, spending time in wooded areas, tick bites, unprotected sexual activity, or IVDU. Patient denies headaches or neck stiffness over this course.  Patient has mild dry cough, but had no shortness breath or chest pain.  Denies nausea, vomiting, diarrhea, abdominal pain, symptoms of UTI or unilateral weakness.  He has difficulty swallowing due to sore throat.  ED Course: pt was found to have WBC 11.7, lactic acid of 0.8, negative urinalysis, CK 42, negative flu PCR, electrolytes renal function okay, temperature 102.3, heart rate 70s, oxygen saturation 96% on room air, chest x-ray negative.  Patient is placed on MedSurg bed for observation.  Dr. Megan Salon of infectious disease was consulted by EDP.   Review of Systems:   General: has fevers, chills,  no body weight gain, has fatigue HEENT: no blurry vision, hearing changes.  has sore throat and oral uler. Respiratory: no dyspnea, coughing, wheezing CV: no chest pain, no palpitations GI: no nausea, vomiting, abdominal pain, diarrhea, constipation GU: no dysuria, burning on urination, increased urinary frequency, hematuria  Ext: no leg edema Neuro: no unilateral weakness, numbness, or tingling, no vision change or hearing loss Skin: no rash, no skin tear. MSK: has diffused joint pain, myalgia Heme: No easy bruising.  Travel history: No recent long distant travel.  Allergy:  Allergies  Allergen Reactions  . Amoxicillin Swelling  . Penicillins Swelling    Past Medical History:  Diagnosis Date  . ADHD (attention deficit hyperactivity disorder)   . Dyslexia     Past Surgical History:  Procedure Laterality Date  . HAND SURGERY Right     Social History:  reports that he has never smoked. His smokeless tobacco use includes snuff. He reports current alcohol use. He reports that he does not use drugs.  Family History:  Family History  Problem Relation Age of Onset  . Diabetes Mellitus II Mother      Prior to Admission medications   Medication Sig Start Date End Date Taking? Authorizing Provider  HYDROcodone-acetaminophen (NORCO/VICODIN) 5-325 MG tablet Take 2 tablets by mouth every 4 (four) hours as needed. Patient not taking: Reported on 08/31/2016 12/28/15   Nehemiah Settle, NP  ibuprofen (ADVIL,MOTRIN) 800 MG tablet Take 1 tablet (800 mg total) by mouth 3 (three) times  daily. Patient not taking: Reported on 06/17/2018 09/14/14   Domenic Moras, PA-C  mupirocin ointment (BACTROBAN) 2 % Place 1 application into the nose 2 (two) times daily. Patient not taking: Reported on 08/31/2016 12/28/15   Nehemiah Settle, NP  Pseudoephedrine-Ibuprofen (ADVIL COLD/SINUS PO) Take 1 tablet by mouth every 6 (six) hours as needed (cold symptoms and sinus pressure).    [provider]     Physical Exam: Vitals:   07/07/18 2227 07/08/18 0113 07/08/18 0208 07/08/18 0311  BP: (!) 104/56 105/60 124/86 112/84  Pulse: 79 78 66 61  Resp: _0 Temp: 99.6 F (37.6 C)  98.1 F (36.7 C) 98.2 F (36.8 C)  TempSrc: Oral  Oral Oral  SpO2: 96% 98% 100% 100%  Weight:   80.5 kg   Height:   _1  (1.905 m)    General: Not in acute distress HEENT:       Eyes: PERRL, EOMI, no scleral icterus.       ENT: No discharge from the ears and nose, has pharynx injection, tonsillar enlargement and oral mucous ulcers.       Neck: No JVD, no bruit, no mass felt. Heme: No neck lymph node enlargement. Cardiac: S1/S2, RRR, No murmurs, No gallops or rubs. Respiratory: No rales, wheezing, rhonchi or rubs. GI: Soft, nondistended, nontender, no rebound pain, no organomegaly, BS present. GU: No hematuria Ext: No pitting leg edema bilaterally. 2+DP/PT pulse bilaterally. Musculoskeletal: No joint deformities, No joint redness or warmth, no limitation of ROM in spin. Has tenderness in both knees and ankles, but no joint swelling.  Skin: Has scattered maculopapular and painful rash all over the body Neuro: Alert, oriented X3, cranial nerves II-XII grossly intact, moves all extremities normally.  Psych: Patient is not psychotic, no suicidal or hemocidal ideation.  Labs on Admission: I have personally reviewed following labs and imaging studies  CBC: Recent Labs  Lab 07/07/18 2126  WBC 11.7*  NEUTROABS 9.1*  HGB 15.7  HCT 47.2  MCV 88.6  PLT 354   Basic Metabolic Panel: Recent Labs  Lab 07/07/18 2126  NA 137  K 4.0  CL 100  CO2 25  GLUCOSE 109*  BUN 14  CREATININE 1.03  CALCIUM 9.2   GFR: Estimated Creatinine Clearance: 122.7 mL/min (by C-G formula based on SCr of 1.03 mg/dL). Liver Function Tests: Recent Labs  Lab 07/07/18 2126  AST 19  ALT 27  ALKPHOS 47  BILITOT 1.3*  PROT 8.0  ALBUMIN 4.0   No results for input(s): LIPASE, AMYLASE in the last 168 hours. No  results for input(s): AMMONIA in the last 168 hours. Coagulation Profile: Recent Labs  Lab 07/08/18 0156  INR 1.10   Cardiac Enzymes: Recent Labs  Lab 07/07/18 2126  CKTOTAL 42*   BNP (last 3 results) No results for input(s): PROBNP in the last 8760 hours. HbA1C: No results for input(s): HGBA1C in the last 72 hours. CBG: No results for input(s): GLUCAP in the last 168 hours. Lipid Profile: No results for input(s): CHOL, HDL, LDLCALC, TRIG, CHOLHDL, LDLDIRECT in the last 72 hours. Thyroid Function Tests: No results for input(s): TSH, T4TOTAL, FREET4, T3FREE, THYROIDAB in the last 72 hours. Anemia Panel: No results for input(s): VITAMINB12, FOLATE, FERRITIN, TIBC, IRON, RETICCTPCT in the last 72 hours. Urine analysis:    Component Value Date/Time   COLORURINE YELLOW 07/07/2018 2126   APPEARANCEUR CLEAR 07/07/2018 2126   LABSPEC 1.023 07/07/2018 2126   PHURINE 6.0 07/07/2018 2126  GLUCOSEU NEGATIVE 07/07/2018 2126   HGBUR SMALL (A) 07/07/2018 2126   BILIRUBINUR NEGATIVE 07/07/2018 2126   KETONESUR 20 (A) 07/07/2018 2126   PROTEINUR NEGATIVE 07/07/2018 2126   NITRITE NEGATIVE 07/07/2018 2126   LEUKOCYTESUR NEGATIVE 07/07/2018 2126   Sepsis Labs: _0 (procalcitonin:4,lacticidven:4) )No results found for this or any previous visit (from the past 240 hour(s)).   Radiological Exams on Admission: Dg Chest 2 View  Result Date: 07/07/2018 CLINICAL DATA:  28 y/o  M; fever, cough, and aches for 3 weeks. EXAM: CHEST - 2 VIEW COMPARISON:  04/09/2016 chest radiograph. FINDINGS: Stable heart size and mediastinal contours are within normal limits. Both lungs are clear. The visualized skeletal structures are unremarkable. IMPRESSION: No acute pulmonary process identified. Electronically Signed   By: Kristine Garbe M.D.   On: 07/07/2018 21:35     EKG:  Not done in ED   Assessment/Plan Principal Problem:   SIRS (systemic inflammatory response syndrome) (HCC) Active  Problems:   Fever   Rash   SIRS (systemic inflammatory response syndrome) ([place]): pt medically clear for source with fever, leukocytosis and initially tachypnea.  Patient does not have clear source of infection.  Patient has diffused joint pain and myalgia, seems to have inflammatory process going on.  ED physician discussed with Dr. Megan Salon for infectious disease, who recommended not to start antibiotics unless patient shows signs of clear sepsis.  -will place on tele bed for obs -get Blood culture -Check RPR, ANA, ESR, CRP, hepatitis panel, HIV antibody -will get Procalcitonin and trend lactic acid levels -IVF: 2.5 L of NS bolus in ED, followed by 100 cc/h   Fever: unclear etiology -See above  Rash: -pending skin biopsy by dermatologist    DVT ppx: SQ Lovenox Code Status: Full code Family Communication: Yes, patient's wife and father   at bed side Disposition Plan:  Anticipate discharge back to previous home environment Consults called: Dr. Megan Salon from infectious disease Admission status:   medical floor/obs    Date of Service 07/08/2018    [facility]   If 7PM-7AM, please contact night-coverage [email or web address] Password [facility] 07/08/2018, 3:54 AM

## 2018-07-07 NOTE — ED Notes (Signed)
Patient transported to XRAY 

## 2018-07-07 NOTE — ED Notes (Signed)
ED Provider at bedside. 

## 2018-07-08 ENCOUNTER — Encounter (HOSPITAL_COMMUNITY): Payer: Self-pay | Admitting: Internal Medicine

## 2018-07-08 ENCOUNTER — Other Ambulatory Visit: Payer: Self-pay

## 2018-07-08 DIAGNOSIS — Z881 Allergy status to other antibiotic agents status: Secondary | ICD-10-CM

## 2018-07-08 DIAGNOSIS — R51 Headache: Secondary | ICD-10-CM

## 2018-07-08 DIAGNOSIS — K121 Other forms of stomatitis: Secondary | ICD-10-CM | POA: Diagnosis not present

## 2018-07-08 DIAGNOSIS — F17220 Nicotine dependence, chewing tobacco, uncomplicated: Secondary | ICD-10-CM

## 2018-07-08 DIAGNOSIS — R21 Rash and other nonspecific skin eruption: Secondary | ICD-10-CM | POA: Diagnosis not present

## 2018-07-08 DIAGNOSIS — R509 Fever, unspecified: Secondary | ICD-10-CM | POA: Diagnosis not present

## 2018-07-08 DIAGNOSIS — R6510 Systemic inflammatory response syndrome (SIRS) of non-infectious origin without acute organ dysfunction: Secondary | ICD-10-CM | POA: Diagnosis not present

## 2018-07-08 DIAGNOSIS — Z880 Allergy status to penicillin: Secondary | ICD-10-CM

## 2018-07-08 DIAGNOSIS — J029 Acute pharyngitis, unspecified: Secondary | ICD-10-CM | POA: Diagnosis present

## 2018-07-08 LAB — CBC
HCT: 43.2 % (ref 39.0–52.0)
Hemoglobin: 14.6 g/dL (ref 13.0–17.0)
MCH: 29.9 pg (ref 26.0–34.0)
MCHC: 33.8 g/dL (ref 30.0–36.0)
MCV: 88.5 fL (ref 80.0–100.0)
PLATELETS: 205 10*3/uL (ref 150–400)
RBC: 4.88 MIL/uL (ref 4.22–5.81)
RDW: 12 % (ref 11.5–15.5)
WBC: 10.5 10*3/uL (ref 4.0–10.5)
nRBC: 0 % (ref 0.0–0.2)

## 2018-07-08 LAB — BASIC METABOLIC PANEL
Anion gap: 10 (ref 5–15)
BUN: 13 mg/dL (ref 6–20)
CO2: 26 mmol/L (ref 22–32)
Calcium: 8.6 mg/dL — ABNORMAL LOW (ref 8.9–10.3)
Chloride: 103 mmol/L (ref 98–111)
Creatinine, Ser: 0.94 mg/dL (ref 0.61–1.24)
GFR calc Af Amer: >60 mL/min (ref >60)
GFR calc non Af Amer: >60 mL/min (ref >60)
Glucose, Bld: 101 mg/dL — ABNORMAL HIGH (ref 70–99)
Potassium: 4.5 mmol/L (ref 3.5–5.1)
Sodium: 139 mmol/L (ref 135–145)

## 2018-07-08 LAB — RAPID URINE DRUG SCREEN, HOSP PERFORMED
Amphetamines: NOT DETECTED
Barbiturates: NOT DETECTED
Benzodiazepines: NOT DETECTED
Cocaine: NOT DETECTED
Opiates: NOT DETECTED
Tetrahydrocannabinol: NOT DETECTED

## 2018-07-08 LAB — C-REACTIVE PROTEIN: CRP: 8.6 mg/dL — ABNORMAL HIGH (ref <1.0)

## 2018-07-08 LAB — HIV ANTIBODY (ROUTINE TESTING W REFLEX): HIV Screen 4th Generation wRfx: NONREACTIVE

## 2018-07-08 LAB — INFLUENZA PANEL BY PCR (TYPE A & B)
Influenza A By PCR: NEGATIVE
Influenza B By PCR: NEGATIVE

## 2018-07-08 LAB — PROTIME-INR
INR: 1.1
PROTHROMBIN TIME: 14.1 s (ref 11.4–15.2)

## 2018-07-08 LAB — SEDIMENTATION RATE: SED RATE: 14 mm/h (ref 0–16)

## 2018-07-08 LAB — PROCALCITONIN: Procalcitonin: <0.1 ng/mL

## 2018-07-08 LAB — LACTIC ACID, PLASMA: Lactic Acid, Venous: 1 mmol/L (ref 0.5–1.9)

## 2018-07-08 MED ORDER — ACETAMINOPHEN 160 MG/5ML PO SOLN
650.0000 mg | Freq: Four times a day (QID) | ORAL | Status: DC | PRN
  Administered 2018-07-09: 650 mg via ORAL
  Filled 2018-07-08: qty 20.3

## 2018-07-08 MED ORDER — ONDANSETRON HCL 4 MG/2ML IJ SOLN: 4.0000 mg | Freq: Four times a day (QID) | INTRAMUSCULAR | Status: DC | PRN

## 2018-07-08 MED ORDER — SODIUM CHLORIDE 0.9 % IV BOLUS
1500.0000 mL | Freq: Once | INTRAVENOUS | Status: AC
  Administered 2018-07-08: 1500 mL via INTRAVENOUS

## 2018-07-08 MED ORDER — LIDOCAINE VISCOUS HCL 2 % MT SOLN
15.0000 mL | OROMUCOSAL | Status: DC | PRN
  Administered 2018-07-08: 15 mL via OROMUCOSAL
  Filled 2018-07-08 (×3): qty 15

## 2018-07-08 MED ORDER — ENOXAPARIN SODIUM 40 MG/0.4ML ~~LOC~~ SOLN
40.0000 mg | SUBCUTANEOUS | Status: DC
  Administered 2018-07-08 – 2018-07-10 (×3): 40 mg via SUBCUTANEOUS
  Filled 2018-07-08 (×3): qty 0.4

## 2018-07-08 MED ORDER — ACETAMINOPHEN 325 MG PO TABS
650.0000 mg | ORAL_TABLET | Freq: Four times a day (QID) | ORAL | Status: DC | PRN
  Administered 2018-07-08: 650 mg via ORAL
  Filled 2018-07-08: qty 2

## 2018-07-08 MED ORDER — ONDANSETRON HCL 4 MG PO TABS: 4.0000 mg | ORAL_TABLET | Freq: Four times a day (QID) | ORAL | Status: DC | PRN

## 2018-07-08 MED ORDER — SODIUM CHLORIDE 0.9 % IV SOLN
INTRAVENOUS | Status: DC
  Administered 2018-07-08 – 2018-07-10 (×5): via INTRAVENOUS

## 2018-07-08 MED ORDER — DIPHENHYDRAMINE HCL 25 MG PO CAPS: 25.0000 mg | ORAL_CAPSULE | Freq: Four times a day (QID) | ORAL | Status: DC | PRN

## 2018-07-08 MED ORDER — IBUPROFEN 100 MG/5ML PO SUSP: 400.0000 mg | Freq: Four times a day (QID) | ORAL | Status: DC | PRN

## 2018-07-08 MED ORDER — SENNOSIDES-DOCUSATE SODIUM 8.6-50 MG PO TABS: 1.0000 | ORAL_TABLET | Freq: Every evening | ORAL | Status: DC | PRN

## 2018-07-08 MED ORDER — IBUPROFEN 200 MG PO TABS
400.0000 mg | ORAL_TABLET | Freq: Four times a day (QID) | ORAL | Status: DC | PRN
  Administered 2018-07-08: 400 mg via ORAL
  Filled 2018-07-08: qty 2

## 2018-07-08 NOTE — Progress Notes (Signed)
[facility]  [facility] Infusion Coordinator will follow with ID Team to support home infusion pharmacy services at DC if needed.  If patient discharges after hours, please call [phone].   Sedalia Muta 07/08/2018, 8:38 AM

## 2018-07-08 NOTE — Progress Notes (Signed)
Patient arrived to unit 3 west bed 13 from emergency department. Assisted patient to bed by nursing staff.Oriented patient to nursing unit and call bell system and verbalized understanding.No acute distress noted at present time. Will continue to monitor.

## 2018-07-08 NOTE — ED Notes (Addendum)
Attempted report to 5C. 

## 2018-07-08 NOTE — Progress Notes (Signed)
PROGRESS NOTE    Cristian SchmidtDerrick Weiss  ZOX:[id number] DOB: [birth year] DOA: 07/07/2018 PCP: Jolene ProvostHaimes, David M, MD   Brief Narrative:  Per admission: Patient is a 28 year old male with a history of ADHD and dyslexia presenting for fevers, sore throat, diffuse myalgias and arthralgias, and rash.  He reports that his initial symptoms began approximately 3 weeks ago.  He has been followed by his primary care provider but appears to be worsening.  Patient reports that when this began 3 weeks ago he had strep swab performed which was negative.  Due to increasing swelling of the back to throat he was prescribed clindamycin.  He reports that he was also prescribed Magic mouthwash and fluconazole due to concerns that his throat could have a fungal infection.  He was then placed on steroids initially 50 mg x 3 days 2 weeks ago, and finished a taper yesterday.  He reports that 3 days ago he began having a maculopapular rash all over his body.  He also reports some ulcerations inside his mouth.  Denies any desquamating lesions, or purpuric lesions.  Patient denies any travel, spending time in wooded areas, tick bites, unprotected sexual activity, or IVDU.  Patient denies headaches or neck stiffness over this course.  He denies any dental pain or recent dental infections.  He reports that his throat is become progressively painful to swallow.  He denies any difficulty breathing.  He reports nonproductive cough over this interval.  Patient reports that the fever began tonight.  He does not recall the last time he had a fever in the course of this illness.  He thinks it may have been 2 weeks ago.  Patient had Monospot testing last week which was negative.  He also was seen by dermatologist today who did a skin biopsy and the results are not available yet.   Assessment & Plan:   Principal Problem:   SIRS (systemic inflammatory response syndrome) (HCC) Active Problems:   Fever   Rash   Sirs; patient with a normal white  count, normal CO2, CRP elevated 8.6 lactic acid normal procalcitonin normal.  Case discussed with infectious disease by the admitting provider, recommend against antibiotics.  Continue supportive care follow labs follow blood cultures are currently pending.  Fever.  Resolved, unclear etiology, follow work-up as above, follow-up ID recommendations, vasculitis on the differential, ported negative throat swab for strep, completed a course of clindamycin, fluconazole, and steroid to 50 mg p.o. daily x3 days.  Rash.  As above vasculitis on the differential patient without any focal infection, denies travel, sick contacts, tick bites, or animal exposures.  ANA is pending, skin biopsy from dermatology is been obtained and is pending, hepatitis panel pending, I added cryoglobulins and complement levels.  DVT prophylaxis: Lovenox SQ  Code Status: FULL    Code Status Orders  (From admission, onward)         Start     Ordered   07/08/18 0052  Full code  Continuous     07/08/18 0052        Code Status History    This patient has a current code status but no historical code status.     Family Communication: Wife Disposition Plan:   Unclear at this time Consults called: None Admission status: Observation   Consultants:   Infectious disease  Procedures:  Dg Chest 2 View  Result Date: 07/07/2018 CLINICAL DATA:  28 y/o  M; fever, cough, and aches for 3 weeks. EXAM: CHEST - 2 VIEW COMPARISON:  04/09/2016 chest radiograph. FINDINGS: Stable heart size and mediastinal contours are within normal limits. Both lungs are clear. The visualized skeletal structures are unremarkable. IMPRESSION: No acute pulmonary process identified. Electronically Signed   By: Mitzi Hansen M.D.   On: 07/07/2018 21:35     Antimicrobials:   None per ID    Subjective: Patient reports acute onset around whole body.,  Some new lesions anterior left lower extremity although subtle.  Reports persistent sore  throat.  Objective: Vitals:   07/07/18 2227 07/08/18 0113 07/08/18 0208 07/08/18 0311  BP: (!) 104/56 105/60 124/86 112/84  Pulse: 79 78 66 61  Resp: 16 16 16 16   Temp: 99.6 F (37.6 C)  98.1 F (36.7 C) 98.2 F (36.8 C)  TempSrc: Oral  Oral Oral  SpO2: 96% 98% 100% 100%  Weight:   80.5 kg   Height:   6\' 3"  (1.905 m)     Intake/Output Summary (Last 24 hours) at 07/08/2018 1050 Last data filed at 07/08/2018 0941 Gross per 24 hour  Intake 466.67 ml  Output 1750 ml  Net -1283.33 ml   Filed Weights   07/08/18 0208  Weight: 80.5 kg    Examination:  General exam: Appears calm and comfortable  Respiratory system: Clear to auscultation. Respiratory effort normal. Cardiovascular system: S1 & S2 heard, RRR. No JVD, murmurs, rubs, gallops or clicks. No pedal edema. Gastrointestinal system: Abdomen is nondistended, soft and nontender. No organomegaly or masses felt. Normal bowel sounds heard. Central nervous system: Alert and oriented. No focal neurological deficits. Extremities: Symmetric 5 x 5 power. Skin: No rashes, lesions or ulcers Psychiatry: Judgement and insight appear normal. Mood & affect appropriate.     Data Reviewed: I have personally reviewed following labs and imaging studies  CBC: Recent Labs  Lab 07/07/18 2126 07/08/18 0438  WBC 11.7* 10.5  NEUTROABS 9.1*  --   HGB 15.7 14.6  HCT 47.2 43.2  MCV 88.6 88.5  PLT 216 205   Basic Metabolic Panel: Recent Labs  Lab 07/07/18 2126 07/08/18 0438  NA 137 139  K 4.0 4.5  CL 100 103  CO2 25 26  GLUCOSE 109* 101*  BUN 14 13  CREATININE 1.03 0.94  CALCIUM 9.2 8.6*   GFR: Estimated Creatinine Clearance: 134.4 mL/min (by C-G formula based on SCr of 0.94 mg/dL). Liver Function Tests: Recent Labs  Lab 07/07/18 2126  AST 19  ALT 27  ALKPHOS 47  BILITOT 1.3*  PROT 8.0  ALBUMIN 4.0   No results for input(s): LIPASE, AMYLASE in the last 168 hours. No results for input(s): AMMONIA in the last 168  hours. Coagulation Profile: Recent Labs  Lab 07/08/18 0156  INR 1.10   Cardiac Enzymes: Recent Labs  Lab 07/07/18 2126  CKTOTAL 42*   BNP (last 3 results) No results for input(s): PROBNP in the last 8760 hours. HbA1C: No results for input(s): HGBA1C in the last 72 hours. CBG: No results for input(s): GLUCAP in the last 168 hours. Lipid Profile: No results for input(s): CHOL, HDL, LDLCALC, TRIG, CHOLHDL, LDLDIRECT in the last 72 hours. Thyroid Function Tests: No results for input(s): TSH, T4TOTAL, FREET4, T3FREE, THYROIDAB in the last 72 hours. Anemia Panel: No results for input(s): VITAMINB12, FOLATE, FERRITIN, TIBC, IRON, RETICCTPCT in the last 72 hours. Sepsis Labs: Recent Labs  Lab 07/07/18 2308 07/08/18 0156  PROCALCITON  --  <0.10  LATICACIDVEN 0.8 1.0    No results found for this or any previous visit (from the past 240  hour(s)).       Radiology Studies: Dg Chest 2 View  Result Date: 07/07/2018 CLINICAL DATA:  28 y/o  M; fever, cough, and aches for 3 weeks. EXAM: CHEST - 2 VIEW COMPARISON:  04/09/2016 chest radiograph. FINDINGS: Stable heart size and mediastinal contours are within normal limits. Both lungs are clear. The visualized skeletal structures are unremarkable. IMPRESSION: No acute pulmonary process identified. Electronically Signed   By: Mitzi HansenLance  Furusawa-Stratton M.D.   On: 07/07/2018 21:35        Scheduled Meds: . enoxaparin (LOVENOX) injection  40 mg Subcutaneous Q24H   Continuous Infusions: . sodium chloride 100 mL/hr at 07/08/18 0941     LOS: 0 days    Time spent: 4235 MIN    Burke Keelshristopher Lylie Blacklock, MD [facility]  If 7PM-7AM, please contact night-coverage  07/08/2018, 10:50 AM

## 2018-07-08 NOTE — Consult Note (Signed)
[facility]    Date of Admission:  07/07/2018          Reason for Consult: Fever, maculopapular rash and oral ulcers    Referring Provider: Aviva Kluver, PA  Assessment: I do not know the cause of his recent pharyngitis, oral ulcerations rash and fever.  This may be infectious (viral), autoimmune or acute hypersensitivity reaction.  I doubt that he has any bacterial infection and favor continued observation off of antibiotics.  Plan: 1. Observe off of antibiotics 2. I will follow up tomorrow  Principal Problem:   Fever Active Problems:   Maculopapular rash, generalized   Oral ulceration   SIRS (systemic inflammatory response syndrome) (HCC)   Pharyngitis   Scheduled Meds: . enoxaparin (LOVENOX) injection  40 mg Subcutaneous Q24H   Continuous Infusions: . sodium chloride 100 mL/hr at 07/08/18 0941   PRN Meds:.acetaminophen, diphenhydrAMINE, ibuprofen, lidocaine, ondansetron **OR** ondansetron (ZOFRAN) IV, senna-docusate  HPI: Cristian Weiss is a 28 y.o. male who was in his usual state of good health until about 06/15/2018 when he had onset of sore throat.  Next 48 hours it became quite severe and he was seen in urgent care.  He was afebrile.  He had marked pharyngeal swelling and erythema.  He is breath was malodorous and he had right cervical adenopathy.  For some reason they were not able to perform a throat swab.  He has a history of penicillin allergy so he was treated with 7 days of empiric clindamycin and 3 days of prednisone.  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  While there he developed more upper respiratory congestion.  He saw his primary care provider 2 weeks ago and was started on fluconazole and a prednisone taper.  He took 60 mg for 3 days, 40 mg for 3 days and 20 mg for 3 days.  His last dose was 07/06/2018.  He began to develop diffuse rash and mouth sores on  07/05/2018.  These were associated with diffuse aching in his muscles and joints.  He was seen at [facility] dermatology 2 days ago and had a biopsy of a lesion on his right thigh.  He was not told what might be causing his ulcers and rash.  He developed high fever at home leading to admission last night.  He has not been on any other medications recently.  He has had no unusual travel.  He has not been exposed to anyone with a similar illness that he knows of.   Review of Systems: Review of Systems  Constitutional: Positive for chills, fever and malaise/fatigue. Negative for diaphoresis.  HENT: Positive for congestion and sore throat.   Eyes: Negative for pain and redness.  Respiratory: Positive for cough. Negative for sputum production and shortness of breath.        He has a mild, chronic cough that is unchanged.  Cardiovascular: Negative for chest pain.  Gastrointestinal: Negative for abdominal pain, constipation, diarrhea, nausea and vomiting.  Genitourinary: Negative for dysuria.  Musculoskeletal: Positive for joint pain and myalgias.  Skin: Positive for rash. Negative for itching.  Neurological: Positive for headaches.  Psychiatric/Behavioral: Negative for substance abuse.    Past Medical History:  Diagnosis Date  . ADHD (attention deficit hyperactivity disorder)   . Dyslexia     Social History   Tobacco Use  . Smoking status: Never  Smoker  . Smokeless tobacco: Current User    Types: Snuff  Substance Use Topics  . Alcohol use: Yes  . Drug use: No    Family History  Problem Relation Age of Onset  . Diabetes Mellitus II Mother    Allergies  Allergen Reactions  . Amoxicillin Swelling  . Penicillins Swelling    OBJECTIVE: Blood pressure 119/68, pulse 86, temperature 99 F (37.2 C), temperature source Oral, resp. rate 17, height 6\' 3"  (1.905 m), weight 80.5 kg, SpO2 100 %.  Physical Exam Constitutional:      Comments: He is resting quietly in bed.  He appears  somewhat uncomfortable because of his mouth ulcers.  His father is at the bedside.  HENT:     Mouth/Throat:     Pharynx: No oropharyngeal exudate.     Comments: Multiple ulcers on his buccal mucosa. Eyes:     Conjunctiva/sclera: Conjunctivae normal.  Cardiovascular:     Rate and Rhythm: Normal rate and regular rhythm.     Heart sounds: No murmur.  Pulmonary:     Effort: Pulmonary effort is normal.     Breath sounds: Normal breath sounds.  Abdominal:     Palpations: Abdomen is soft. There is no mass.     Tenderness: There is no abdominal tenderness.  Lymphadenopathy:     Cervical: Cervical adenopathy present.     Right cervical: Superficial cervical adenopathy present.  Skin:    Findings: Erythema and rash present.     Comments: He has generalized red, maculopapular rash on his face, trunk, arms and legs.  The lesions are of varying size.  He has a few lesions on his palms.  There is a clean gauze dressing over his right thigh biopsy site.  Psychiatric:        Mood and Affect: Mood normal.     Lab Results Lab Results  Component Value Date   WBC 10.5 07/08/2018   HGB 14.6 07/08/2018   HCT 43.2 07/08/2018   MCV 88.5 07/08/2018   PLT 205 07/08/2018    Lab Results  Component Value Date   CREATININE 0.94 07/08/2018   BUN 13 07/08/2018   NA 139 07/08/2018   K 4.5 07/08/2018   CL 103 07/08/2018   CO2 26 07/08/2018    Lab Results  Component Value Date   ALT 27 07/07/2018   AST 19 07/07/2018   ALKPHOS 47 07/07/2018   BILITOT 1.3 (H) 07/07/2018     Microbiology: No results found for this or any previous visit (from the past 240 hour(s)).  Cliffton AstersJohn Kaeli Nichelson, MD [facility] [phone] pager   [phone] cell 07/08/2018, 3:08 PM

## 2018-07-09 DIAGNOSIS — J029 Acute pharyngitis, unspecified: Secondary | ICD-10-CM | POA: Diagnosis not present

## 2018-07-09 DIAGNOSIS — R21 Rash and other nonspecific skin eruption: Secondary | ICD-10-CM | POA: Diagnosis not present

## 2018-07-09 DIAGNOSIS — R509 Fever, unspecified: Secondary | ICD-10-CM | POA: Diagnosis not present

## 2018-07-09 DIAGNOSIS — K121 Other forms of stomatitis: Secondary | ICD-10-CM | POA: Diagnosis not present

## 2018-07-09 LAB — ANTINUCLEAR ANTIBODIES, IFA: ANA Ab, IFA: NEGATIVE

## 2018-07-09 MED ORDER — SODIUM CHLORIDE 0.9 % IV SOLN
1000.0000 mg | INTRAVENOUS | Status: DC
  Administered 2018-07-09 – 2018-07-10 (×2): 1000 mg via INTRAVENOUS
  Filled 2018-07-09 (×3): qty 8

## 2018-07-09 MED ORDER — PHENOL 1.4 % MT LIQD
1.0000 | OROMUCOSAL | Status: DC | PRN
  Administered 2018-07-09: 1 via OROMUCOSAL
  Filled 2018-07-09: qty 177

## 2018-07-09 MED ORDER — OXYMETAZOLINE HCL 0.05 % NA SOLN
1.0000 | Freq: Two times a day (BID) | NASAL | Status: DC
  Administered 2018-07-09 – 2018-07-10 (×3): 1 via NASAL
  Filled 2018-07-09: qty 30

## 2018-07-09 NOTE — Plan of Care (Signed)
  Problem: Clinical Measurements: Goal: Ability to maintain clinical measurements within normal limits will improve Outcome: Progressing   Problem: Pain Managment: Goal: General experience of comfort will improve Outcome: Progressing   Problem: Skin Integrity: Goal: Risk for impaired skin integrity will decrease Outcome: Progressing   

## 2018-07-09 NOTE — Progress Notes (Signed)
Patient ID: Cristian Weiss, male   DOB: 1990-12-04, 28 y.o.   MRN: [id number]         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  He does have 1 scrotal lesion.  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  However, what is odd is that this all began while he was on steroids and seems to have improved spontaneously off of steroids (methylprednisolone was started this morning after his temperature came down and the rash improved).  PLAN: 1. Observe off of antibiotics 2. Symptomatic therapy for oral ulcers 3. He can probably be safely discharged tomorrow with follow-up with dermatology  Principal Problem:   Fever Active Problems:   Maculopapular rash, generalized   Oral ulceration   SIRS (systemic inflammatory response syndrome) (HCC)   Pharyngitis   Scheduled Meds: . enoxaparin (LOVENOX) injection  40 mg Subcutaneous Q24H   Continuous Infusions: . sodium chloride 100 mL/hr at 07/09/18 1304  . methylPREDNISolone (SOLU-MEDROL) injection 1,000 mg (07/09/18 1101)   PRN Meds:.acetaminophen (TYLENOL) oral liquid 160 mg/5 mL, diphenhydrAMINE, ibuprofen, lidocaine, ondansetron **OR** ondansetron (ZOFRAN) IV, phenol, senna-docusate   SUBJECTIVE: The pain in his mouth and throat is unchanged.  He notes that his rash is fading.  His myalgias and arthralgias have improved.  Overall he is feeling a little bit better.  Review of Systems: Review of Systems  Constitutional: Negative for chills, diaphoresis and fever.  Respiratory: Negative for cough.   Cardiovascular: Negative for chest pain.  Musculoskeletal: Positive for joint pain and myalgias.  Skin: Positive for rash. Negative for itching.    Allergies  Allergen Reactions  . Amoxicillin Swelling  . Penicillins Swelling     OBJECTIVE: Vitals:   07/09/18 0003 07/09/18 0417 07/09/18 0749 07/09/18 1132  BP: 132/85 (!) 142/86 124/86 137/84  Pulse: 72 93 79 71  Resp: 18 18 18 18   Temp: 98.6 F (37 C) 98.9 F (37.2 C) 98.6 F (37 C) 98.9 F (37.2 C)  TempSrc: Oral Oral Oral Oral  SpO2: 98% 98% 98% 99%  Weight:      Height:       Body mass index is 22.18 kg/m.  Physical Exam Constitutional:      Comments: He is resting quietly in bed.  His father is at the bedside.  HENT:     Mouth/Throat:     Comments: No change in oropharyngeal ulcers. Eyes:     Conjunctiva/sclera: Conjunctivae normal.  Musculoskeletal:        General: No swelling or tenderness.  Skin:    Findings: Rash present.     Comments: I do not see any new skin lesions.  The old skin lesions are markedly improved overnight.     Lab Results Lab Results  Component Value Date   WBC 10.5 07/08/2018   HGB 14.6 07/08/2018   HCT 43.2 07/08/2018   MCV 88.5 07/08/2018   PLT 205 07/08/2018    Lab Results  Component Value Date   CREATININE 0.94 07/08/2018   BUN 13 07/08/2018   NA 139 07/08/2018   K 4.5 07/08/2018   CL 103 07/08/2018   CO2 26 07/08/2018    Lab Results  Component Value Date   ALT 27 07/07/2018   AST 19 07/07/2018   ALKPHOS 47 07/07/2018   BILITOT 1.3 (H) 07/07/2018  Microbiology: No results found for this or any previous visit (from the past 240 hour(s)).  Cliffton Asters, MD [facility] [phone] pager   [phone] cell 07/09/2018, 1:18 PM

## 2018-07-09 NOTE — Progress Notes (Signed)
PROGRESS NOTE    Cristian SchmidtDerrick Weiss  ZOX:[id number] DOB: 1990-12-06 DOA: 07/07/2018 PCP: Jolene ProvostHaimes, David M, MD   Brief Narrative:  Per admission: Patient is a 28 year old male with a history of ADHD and dyslexia presenting for fevers, sore throat, diffuse myalgias and arthralgias, and rash. He reports that his initial symptoms began approximately 3 weeks ago. He has been followed by his primary care provider but appears to be worsening. Patient reports that when this began 3 weeks ago he had strep swab performed which was negative. Due to increasing swelling of the back to throat he was prescribed clindamycin. He reports that he was also prescribed Magic mouthwash and fluconazole due to concerns that his throat could have a fungal infection. He was then placed on steroids initially 50 mg x 3 days 2 weeks ago, and finished a taper yesterday. He reports that 3 days ago he began having a maculopapular rash all over his body. He also reports some ulcerations inside his mouth. Denies any desquamating lesions, or purpuric lesions. Patient denies any travel, spending time in wooded areas, tick bites, unprotected sexual activity, or IVDU. Patient denies headaches or neck stiffness over this course. He denies any dental pain or recent dental infections.He reports that his throat is become progressively painful to swallow. He denies any difficulty breathing. He reports nonproductive cough over this interval. Patient reports that the fever began tonight. He does not recall the last time he had a fever in the course of this illness. He thinks it may have been 2 weeks ago.  Patient had Monospot testing last week which was negative. He also was seen by dermatologist today who did a skin biopsy and the results are not available yet.   Assessment & Plan:   Principal Problem:   SIRS (systemic inflammatory response syndrome) (HCC) Active Problems:   Fever   Rash   Sirs; patient with a normal  white count, normal CO2, CRP elevated 8.6 lactic acid normal procalcitonin normal.  Case discussed with infectious disease by the admitting provider,  they also saw in consultation and continue to recommend against antibiotics.  Continue supportive care follow labs follow blood cultures are currently pending.  Fever.  Resolved, unclear etiology, follow work-up as above, follow-up ID recommendations, vasculitis on the differential, reported negative throat swab for strep, completed a course of clindamycin, fluconazole, and steroid to 50 mg p.o. daily x3 days  Rash.  As above vasculitis, behcets, infection,  on the differential patient without any focal infection, denies travel, sick contacts, tick bites, or animal exposures.  ANA is pending, skin biopsy from dermatology is been obtained and is pending, hepatitis panel pending, I added cryoglobulins and complement levels which are also pending.  These are all send out labs.  Patient's rash similar to vasculitic rash going to add high-dose steroids 1 g daily.  Patient reported good response previously  DVT prophylaxis: Lovenox SQ  Code Status: Full    Code Status Orders  (From admission, onward)         Start     Ordered   07/08/18 0052  Full code  Continuous     07/08/18 0052        Code Status History    This patient has a current code status but no historical code status.     Family Communication: Wife Disposition Plan:   [place] 1 to 2 days Consults called: Infectious disease Admission status: Observation   Consultants:   Infectious disease  Procedures:  Dg Chest  2 View  Result Date: 07/07/2018 CLINICAL DATA:  27 y/o  M; fever, cough, and aches for 3 weeks. EXAM: CHEST - 2 VIEW COMPARISON:  04/09/2016 chest radiograph. FINDINGS: Stable heart size and mediastinal contours are within normal limits. Both lungs are clear. The visualized skeletal structures are unremarkable. IMPRESSION: No acute pulmonary process identified.  Electronically Signed   By: Mitzi Hansen M.D.   On: 07/07/2018 21:35     Antimicrobials:   No antibiotics   Subjective: Patient reports rash still persistent, has difficulty with odynophagia which has been persistent  Objective: Vitals:   07/09/18 0003 07/09/18 0417 07/09/18 0749 07/09/18 1132  BP: 132/85 (!) 142/86 124/86 137/84  Pulse: 72 93 79 71  Resp: 18 18 18 18   Temp: 98.6 F (37 C) 98.9 F (37.2 C) 98.6 F (37 C) 98.9 F (37.2 C)  TempSrc: Oral Oral Oral Oral  SpO2: 98% 98% 98% 99%  Weight:      Height:        Intake/Output Summary (Last 24 hours) at 07/09/2018 1142 Last data filed at 07/08/2018 1900 Gross per 24 hour  Intake 797.27 ml  Output 1150 ml  Net -352.73 ml   Filed Weights   07/08/18 0208  Weight: 80.5 kg    Examination:  General exam: Patient is calm, although concerned about rash Respiratory system: Clear to auscultation. Respiratory effort normal. Cardiovascular system: S1 & S2 heard, RRR. No JVD, murmurs, rubs, gallops or clicks. No pedal edema. Gastrointestinal system: Abdomen is nondistended, soft and nontender. No organomegaly or masses felt. Normal bowel sounds heard. Central nervous system: Alert and oriented. No focal neurological deficits. Extremities: Symmetric 5 x 5 power. Skin: purpuric rash throughout body, oral ulcers Psychiatry: Judgement and insight appear normal. Mood & affect appropriate.     Data Reviewed: I have personally reviewed following labs and imaging studies  CBC: Recent Labs  Lab 07/07/18 2126 07/08/18 0438  WBC 11.7* 10.5  NEUTROABS 9.1*  --   HGB 15.7 14.6  HCT 47.2 43.2  MCV 88.6 88.5  PLT 216 205   Basic Metabolic Panel: Recent Labs  Lab 07/07/18 2126 07/08/18 0438  NA 137 139  K 4.0 4.5  CL 100 103  CO2 25 26  GLUCOSE 109* 101*  BUN 14 13  CREATININE 1.03 0.94  CALCIUM 9.2 8.6*   GFR: Estimated Creatinine Clearance: 134.4 mL/min (by C-G formula based on SCr of 0.94  mg/dL). Liver Function Tests: Recent Labs  Lab 07/07/18 2126  AST 19  ALT 27  ALKPHOS 47  BILITOT 1.3*  PROT 8.0  ALBUMIN 4.0   No results for input(s): LIPASE, AMYLASE in the last 168 hours. No results for input(s): AMMONIA in the last 168 hours. Coagulation Profile: Recent Labs  Lab 07/08/18 0156  INR 1.10   Cardiac Enzymes: Recent Labs  Lab 07/07/18 2126  CKTOTAL 42*   BNP (last 3 results) No results for input(s): PROBNP in the last 8760 hours. HbA1C: No results for input(s): HGBA1C in the last 72 hours. CBG: No results for input(s): GLUCAP in the last 168 hours. Lipid Profile: No results for input(s): CHOL, HDL, LDLCALC, TRIG, CHOLHDL, LDLDIRECT in the last 72 hours. Thyroid Function Tests: No results for input(s): TSH, T4TOTAL, FREET4, T3FREE, THYROIDAB in the last 72 hours. Anemia Panel: No results for input(s): VITAMINB12, FOLATE, FERRITIN, TIBC, IRON, RETICCTPCT in the last 72 hours. Sepsis Labs: Recent Labs  Lab 07/07/18 2308 07/08/18 0156  PROCALCITON  --  <0.10  LATICACIDVEN  0.8 1.0    No results found for this or any previous visit (from the past 240 hour(s)).       Radiology Studies: Dg Chest 2 View  Result Date: 07/07/2018 CLINICAL DATA:  28 y/o  M; fever, cough, and aches for 3 weeks. EXAM: CHEST - 2 VIEW COMPARISON:  04/09/2016 chest radiograph. FINDINGS: Stable heart size and mediastinal contours are within normal limits. Both lungs are clear. The visualized skeletal structures are unremarkable. IMPRESSION: No acute pulmonary process identified. Electronically Signed   By: Mitzi HansenLance  Furusawa-Stratton M.D.   On: 07/07/2018 21:35        Scheduled Meds: . enoxaparin (LOVENOX) injection  40 mg Subcutaneous Q24H   Continuous Infusions: . sodium chloride 100 mL/hr at 07/09/18 0434  . methylPREDNISolone (SOLU-MEDROL) injection 1,000 mg (07/09/18 1101)     LOS: 0 days    Time spent: 40 min    Burke Keelshristopher Spongberg, MD [facility]  If 7PM-7AM, please contact night-coverage  07/09/2018, 11:42 AM

## 2018-07-10 DIAGNOSIS — Z888 Allergy status to other drugs, medicaments and biological substances status: Secondary | ICD-10-CM | POA: Diagnosis not present

## 2018-07-10 DIAGNOSIS — K121 Other forms of stomatitis: Secondary | ICD-10-CM | POA: Diagnosis present

## 2018-07-10 DIAGNOSIS — N5089 Other specified disorders of the male genital organs: Secondary | ICD-10-CM | POA: Diagnosis not present

## 2018-07-10 DIAGNOSIS — M352 Behcet's disease: Principal | ICD-10-CM

## 2018-07-10 DIAGNOSIS — R509 Fever, unspecified: Secondary | ICD-10-CM | POA: Diagnosis not present

## 2018-07-10 DIAGNOSIS — Z79899 Other long term (current) drug therapy: Secondary | ICD-10-CM | POA: Diagnosis not present

## 2018-07-10 DIAGNOSIS — R480 Dyslexia and alexia: Secondary | ICD-10-CM | POA: Diagnosis present

## 2018-07-10 DIAGNOSIS — F17220 Nicotine dependence, chewing tobacco, uncomplicated: Secondary | ICD-10-CM | POA: Diagnosis present

## 2018-07-10 DIAGNOSIS — J029 Acute pharyngitis, unspecified: Secondary | ICD-10-CM | POA: Diagnosis present

## 2018-07-10 DIAGNOSIS — R21 Rash and other nonspecific skin eruption: Secondary | ICD-10-CM | POA: Diagnosis present

## 2018-07-10 DIAGNOSIS — H5710 Ocular pain, unspecified eye: Secondary | ICD-10-CM

## 2018-07-10 DIAGNOSIS — L98499 Non-pressure chronic ulcer of skin of other sites with unspecified severity: Secondary | ICD-10-CM | POA: Diagnosis present

## 2018-07-10 DIAGNOSIS — F909 Attention-deficit hyperactivity disorder, unspecified type: Secondary | ICD-10-CM | POA: Diagnosis present

## 2018-07-10 DIAGNOSIS — Z880 Allergy status to penicillin: Secondary | ICD-10-CM | POA: Diagnosis not present

## 2018-07-10 DIAGNOSIS — R6510 Systemic inflammatory response syndrome (SIRS) of non-infectious origin without acute organ dysfunction: Secondary | ICD-10-CM | POA: Diagnosis present

## 2018-07-10 LAB — HEPATITIS PANEL, ACUTE
HCV Ab: <0.1 s/co ratio (ref 0.0–0.9)
Hep A IgM: NEGATIVE
Hep B C IgM: NEGATIVE
Hepatitis B Surface Ag: NEGATIVE

## 2018-07-10 LAB — COMPLEMENT, TOTAL: Compl, Total (CH50): >60 U/mL (ref >41)

## 2018-07-10 MED ORDER — PHENOL 1.4 % MT LIQD: 1.0000 | OROMUCOSAL | 0 refills | Status: DC | PRN

## 2018-07-10 MED ORDER — PREDNISONE 20 MG PO TABS: 60.0000 mg | ORAL_TABLET | Freq: Every day | ORAL | 2 refills | Status: AC

## 2018-07-10 MED FILL — predniSONE 20 MG TABS: 20 | 5 days supply | Qty: 15 | Fill #0

## 2018-07-10 NOTE — Discharge Summary (Signed)
Physician Discharge Summary  Cristian Weiss ZOX:[id number] DOB: 08-May-1991 DOA: 07/07/2018  PCP: Jolene Provost, MD  Admit date: 07/07/2018 Discharge date: 07/10/2018  Admitted From: Inpatient Disposition: home  Recommendations for Outpatient Follow-up:  1. Follow up with PCP in 1-2 weeks 2. Please obtain BMP/CBC in one week 3. Please follow up on the following pending results:  Home Health:No Equipment/Devices:none  Discharge Condition:Stable CODE STATUS:Full code Diet recommendation: Regular healthy diet  Brief/Interim Summary: Per admission:Patient is a 28 year old male with a history of ADHD and dyslexia presenting for fevers, sore throat, diffuse myalgias and arthralgias, and rash. He reports that his initial symptoms began approximately 3 weeks ago. He has been followed by his primary care provider but appears to be worsening. Patient reports that when this began 3 weeks ago he had strep swab performed which was negative. Due to increasing swelling of the back to throat he was prescribed clindamycin. He reports that he was also prescribed Magic mouthwash and fluconazole due to concerns that his throat could have a fungal infection. He was then placed on steroids initially 50 mg x 3 days 2 weeks ago, and finished a taper yesterday. He reports that 3 days ago he began having a maculopapular rash all over his body. He also reports some ulcerations inside his mouth. Denies any desquamating lesions, or purpuric lesions. Patient denies any travel, spending time in wooded areas, tick bites, unprotected sexual activity, or IVDU. Patient denies headaches or neck stiffness over this course. He denies any dental pain or recent dental infections.He reports that his throat is become progressively painful to swallow. He denies any difficulty breathing. He reports nonproductive cough over this interval. Patient reports that the fever began tonight. He does not recall the last time he had a  fever in the course of this illness. He thinks it may have been 2 weeks ago.  Patient had Monospot testing last week which was negative. He also was seen by dermatologist today who did a skin biopsy and the results are not available yet.  Hospital course: Sirs;patient had a normal white count, normal CO2, CRP elevated 8.6 lactic acid normal procalcitonin normal. Case discussed with infectious disease by the admitting provider, they also saw in consultation and recommended against antibiotics agree with. We continued supportive care, followed labs with a normal white count normal electrolytes normal renal function, followed blood cultures were negative  Fever. Resolved, unclear etiology although autoimmune likely, follow work-up as above, vasculitis on the differential, reported negative throat swab for strep, completed a course of clindamycin, fluconazole, and steroid to 50 mg p.o. 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 As above vasculitis, behcets, infection,  on the differential patient without any focal infection, denies travel, sick contacts, tick bites, or animal exposures.  ANA is negative Panel is negative Complement level greater than 60 Cryoglobulins pending Physical exam was consistent with painful oral, skin, and genital ulcerations.  Patient was treated symptomatically with viscous lidocaine and Chloraseptic spray.  He improved significantly with high-dose steroids. 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  As above, he was treated with high-dose steroids of Solu-Medrol 1 g daily and will be discharged on prednisone 60 mg p.o. daily with close follow-up with rheumatology.  Fortunately his wife works for a rheumatologist and I am anticipating an appointment this week.  Patient denied having any ocular pain or visual disturbances but he was instructed to have also close follow-up with ophthalmology to further  evaluate.  Discharge Diagnoses:  Principal Problem:   Fever Active Problems:   Maculopapular rash, generalized   SIRS (systemic inflammatory response syndrome) (HCC)   Pharyngitis   Oral ulceration    Discharge Instructions  Discharge Instructions    Call MD for:   Complete by:  As directed    Any acute change in medical condition   Diet - low sodium heart healthy   Complete by:  As directed    Increase activity slowly   Complete by:  As directed      Allergies as of 07/10/2018      Reactions   Amoxicillin Swelling   Penicillins Swelling      Medication List    TAKE these medications   acetaminophen 325 MG tablet Commonly known as:  TYLENOL Take 650 mg by mouth every 6 (six) hours as needed for fever.   diphenhydrAMINE 25 mg capsule Commonly known as:  BENADRYL Take 25 mg by mouth every 6 (six) hours as needed for allergies.   HYDROcodone-acetaminophen 5-325 MG tablet Commonly known as:  NORCO/VICODIN Take 2 tablets by mouth every 4 (four) hours as needed.   ibuprofen 800 MG tablet Commonly known as:  ADVIL,MOTRIN Take 1 tablet (800 mg total) by mouth 3 (three) times daily.   mupirocin ointment 2 % Commonly known as:  BACTROBAN Place 1 application into the nose 2 (two) times daily.   phenol 1.4 % Liqd Commonly known as:  CHLORASEPTIC Use as directed 1 spray in the mouth or throat as needed for throat irritation / pain.   predniSONE 20 MG tablet Commonly known as:  DELTASONE Take 3 tablets (60 mg total) by mouth daily for 5 days.       Allergies  Allergen Reactions  . Amoxicillin Swelling  . Penicillins Swelling    Consultations:  infectious disease   rheumatology   Procedures/Studies: Dg Chest 2 View  Result Date: 07/07/2018 CLINICAL DATA:  28 y/o  M; fever, cough, and aches for 3 weeks. EXAM: CHEST - 2 VIEW COMPARISON:  04/09/2016 chest radiograph. FINDINGS: Stable heart size and mediastinal contours are within normal limits. Both lungs  are clear. The visualized skeletal structures are unremarkable. IMPRESSION: No acute pulmonary process identified. Electronically Signed   By: Mitzi HansenLance  Furusawa-Stratton M.D.   On: 07/07/2018 21:35       Subjective: Patient reports symptomatically starting to feel much better.  Still has pain with swallowing but significantly improved, lesions are healing.  Discharge Exam: Vitals:   07/10/18 0416 07/10/18 0417  BP:  113/79  Pulse: 62 (!) 48  Resp:  16  Temp:  97.9 F (36.6 C)  SpO2: 100% 99%   Vitals:   07/09/18 2019 07/09/18 2311 07/10/18 0416 07/10/18 0417  BP: 122/84 123/80  113/79  Pulse: 60 60 62 (!) 48  Resp: 18 18  16   Temp: 98.8 F (37.1 C) 97.7 F (36.5 C)  97.9 F (36.6 C)  TempSrc: Oral Oral  Oral  SpO2: 99% 99% 100% 99%  Weight:      Height:        General: Pt is alert, awake, not in acute distress Cardiovascular: RRR, S1/S2 +, no rubs, no gallops Respiratory: CTA bilaterally, no wheezing, no rhonchi Abdominal: Soft, NT, ND, bowel sounds + Extremities: no edema, no cyanosis    The results of significant diagnostics from this hospitalization (including imaging, microbiology, ancillary and laboratory) are listed below for reference.     Microbiology: Recent Results (from the past 240  hour(s))  Culture, blood (routine x 2)     Status: None (Preliminary result)   Collection Time: 07/07/18 11:08 PM  Result Value Ref Range Status   Specimen Description BLOOD LEFT ARM  Final   Special Requests   Final    BOTTLES DRAWN AEROBIC ONLY Blood Culture results may not be optimal due to an excessive volume of blood received in culture bottles   Culture   Final    NO GROWTH 2 DAYS Performed at [facility], [address] [place], [place] [address]    Report Status PENDING  Incomplete  Culture, blood (routine x 2)     Status: None (Preliminary result)   Collection Time: 07/07/18 11:45 PM  Result Value Ref Range Status   Specimen Description BLOOD RIGHT ARM   Final   Special Requests   Final    BOTTLES DRAWN AEROBIC AND ANAEROBIC Blood Culture results may not be optimal due to an excessive volume of blood received in culture bottles   Culture   Final    NO GROWTH 2 DAYS Performed at [facility], [address] [place], [place] [address]    Report Status PENDING  Incomplete     Labs: BNP (last 3 results) No results for input(s): BNP in the last 8760 hours. Basic Metabolic Panel: Recent Labs  Lab 07/07/18 2126 07/08/18 0438  NA 137 139  K 4.0 4.5  CL 100 103  CO2 25 26  GLUCOSE 109* 101*  BUN 14 13  CREATININE 1.03 0.94  CALCIUM 9.2 8.6*   Liver Function Tests: Recent Labs  Lab 07/07/18 2126  AST 19  ALT 27  ALKPHOS 47  BILITOT 1.3*  PROT 8.0  ALBUMIN 4.0   No results for input(s): LIPASE, AMYLASE in the last 168 hours. No results for input(s): AMMONIA in the last 168 hours. CBC: Recent Labs  Lab 07/07/18 2126 07/08/18 0438  WBC 11.7* 10.5  NEUTROABS 9.1*  --   HGB 15.7 14.6  HCT 47.2 43.2  MCV 88.6 88.5  PLT 216 205   Cardiac Enzymes: Recent Labs  Lab 07/07/18 2126  CKTOTAL 42*   BNP: Invalid input(s): POCBNP CBG: No results for input(s): GLUCAP in the last 168 hours. D-Dimer No results for input(s): DDIMER in the last 72 hours. Hgb A1c No results for input(s): HGBA1C in the last 72 hours. Lipid Profile No results for input(s): CHOL, HDL, LDLCALC, TRIG, CHOLHDL, LDLDIRECT in the last 72 hours. Thyroid function studies No results for input(s): TSH, T4TOTAL, T3FREE, THYROIDAB in the last 72 hours.  Invalid input(s): FREET3 Anemia work up No results for input(s): VITAMINB12, FOLATE, FERRITIN, TIBC, IRON, RETICCTPCT in the last 72 hours. Urinalysis    Component Value Date/Time   COLORURINE YELLOW 07/07/2018 2126   APPEARANCEUR CLEAR 07/07/2018 2126   LABSPEC 1.023 07/07/2018 2126   PHURINE 6.0 07/07/2018 2126   GLUCOSEU NEGATIVE 07/07/2018 2126   HGBUR SMALL (A) 07/07/2018 2126    BILIRUBINUR NEGATIVE 07/07/2018 2126   KETONESUR 20 (A) 07/07/2018 2126   PROTEINUR NEGATIVE 07/07/2018 2126   NITRITE NEGATIVE 07/07/2018 2126   LEUKOCYTESUR NEGATIVE 07/07/2018 2126   Sepsis Labs Invalid input(s): PROCALCITONIN,  WBC,  LACTICIDVEN Microbiology Recent Results (from the past 240 hour(s))  Culture, blood (routine x 2)     Status: None (Preliminary result)   Collection Time: 07/07/18 11:08 PM  Result Value Ref Range Status   Specimen Description BLOOD LEFT ARM  Final   Special Requests  Final    BOTTLES DRAWN AEROBIC ONLY Blood Culture results may not be optimal due to an excessive volume of blood received in culture bottles   Culture   Final    NO GROWTH 2 DAYS Performed at [facility], [address] [place], [place] [address]    Report Status PENDING  Incomplete  Culture, blood (routine x 2)     Status: None (Preliminary result)   Collection Time: 07/07/18 11:45 PM  Result Value Ref Range Status   Specimen Description BLOOD RIGHT ARM  Final   Special Requests   Final    BOTTLES DRAWN AEROBIC AND ANAEROBIC Blood Culture results may not be optimal due to an excessive volume of blood received in culture bottles   Culture   Final    NO GROWTH 2 DAYS Performed at [facility], [address] [place], [place] [address]    Report Status PENDING  Incomplete     Time coordinating discharge: Over 30 minutes  SIGNED:   Burke Keels, MD  [facility] 07/10/2018, 2:44 PM Pager   If 7PM-7AM, please contact night-coverage [email or web address] Password [email or web address]

## 2018-07-10 NOTE — Care Management Note (Signed)
Case Management Note  Patient Details  Name: Cristian Weiss MRN: [id number] Date of Birth: 11/18/1990  Subjective/Objective:                    Action/Plan: Pt discharging home with self care. Pt has PcP, insurance and transportation home.   Expected Discharge Date:  07/10/18               Expected Discharge Plan:  Home/Self Care  In-House Referral:     Discharge planning Services     Post Acute Care Choice:    Choice offered to:     DME Arranged:    DME Agency:     HH Arranged:    HH Agency:     Status of Service:  Completed, signed off  If discussed at [employer] of Stay Meetings, dates discussed:    Additional Comments:  Kermit Balo, RN 07/10/2018, 4:56 PM

## 2018-07-10 NOTE — Progress Notes (Signed)
Subjective: N Patient feeling much better after high dose of Solu-Medrol yesterday.   Antibiotics:  Anti-infectives (From admission, onward)   None      Medications: Scheduled Meds: . enoxaparin (LOVENOX) injection  40 mg Subcutaneous Q24H  . oxymetazoline  1 spray Each Nare BID   Continuous Infusions: . sodium chloride 100 mL/hr at 07/10/18 0724  . methylPREDNISolone (SOLU-MEDROL) injection 1,000 mg (07/10/18 0903)   PRN Meds:.acetaminophen (TYLENOL) oral liquid 160 mg/5 mL, diphenhydrAMINE, ibuprofen, lidocaine, ondansetron **OR** ondansetron (ZOFRAN) IV, phenol, senna-docusate    Objective: Weight change:  No intake or output data in the 24 hours ending 07/10/18 1346 Blood pressure 113/79, pulse (!) 48, temperature 97.9 F (36.6 C), temperature source Oral, resp. rate 16, height 6\' 3"  (1.905 m), weight 80.5 kg, SpO2 99 %. Temp:  [97.7 F (36.5 C)-98.8 F (37.1 C)] 97.9 F (36.6 C) (02/10 0417) Pulse Rate:  [48-66] 48 (02/10 0417) Resp:  [16-18] 16 (02/10 0417) BP: (113-142)/(79-85) 113/79 (02/10 0417) SpO2:  [96 %-100 %] 99 % (02/10 0417)  Physical Exam: General: Alert and awake, oriented x3, not in any acute distress. HEENT: anicteric sclera, EOMI CVS regular rate, normal  Chest: , no wheezing, no respiratory distress Abdomen: soft non-distended,  Neuro: nonfocal  Skin including oral mucosa 07/10/2018:      Genital ulcer:    Rash on extremities:        CBC:    BMET Recent Labs    07/07/18 2126 07/08/18 0438  NA 137 139  K 4.0 4.5  CL 100 103  CO2 25 26  GLUCOSE 109* 101*  BUN 14 13  CREATININE 1.03 0.94  CALCIUM 9.2 8.6*     Liver Panel  Recent Labs    07/07/18 2126  PROT 8.0  ALBUMIN 4.0  AST 19  ALT 27  ALKPHOS 47  BILITOT 1.3*       Sedimentation Rate Recent Labs    07/08/18 0156  ESRSEDRATE 14   C-Reactive Protein Recent Labs    07/08/18 0156  CRP 8.6*    Micro Results: Recent  Results (from the past 720 hour(s))  Culture, blood (routine x 2)     Status: None (Preliminary result)   Collection Time: 07/07/18 11:08 PM  Result Value Ref Range Status   Specimen Description BLOOD LEFT ARM  Final   Special Requests   Final    BOTTLES DRAWN AEROBIC ONLY Blood Culture results may not be optimal due to an excessive volume of blood received in culture bottles   Culture   Final    NO GROWTH 1 DAY Performed at [facility], [address] [place], [place] [address]    Report Status PENDING  Incomplete  Culture, blood (routine x 2)     Status: None (Preliminary result)   Collection Time: 07/07/18 11:45 PM  Result Value Ref Range Status   Specimen Description BLOOD RIGHT ARM  Final   Special Requests   Final    BOTTLES DRAWN AEROBIC AND ANAEROBIC Blood Culture results may not be optimal due to an excessive volume of blood received in culture bottles   Culture   Final    NO GROWTH 1 DAY Performed at [facility], [address], [place], [place] [address]    Report Status PENDING  Incomplete    Studies/Results: No results found.    Assessment/Plan:  INTERVAL HISTORY: Dramatic improvement on corticosteroids   Principal Problem:   Fever  Active Problems:   Maculopapular rash, generalized   SIRS (systemic inflammatory response syndrome) (HCC)   Pharyngitis   Oral ulceration    Ethin Fortenberry is a 28 y.o. 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  I will sign off for now please call with further questions.   LOS: 0 days   Acey Lav 07/10/2018, 1:46 PM

## 2018-07-11 ENCOUNTER — Encounter: Payer: Self-pay | Admitting: Rheumatology

## 2018-07-11 ENCOUNTER — Ambulatory Visit: Payer: BLUE CROSS/BLUE SHIELD | Admitting: Rheumatology

## 2018-07-11 VITALS — BP 110/61 | HR 57 | Resp 14 | Ht 75.0 in | Wt 179.8 lb

## 2018-07-11 DIAGNOSIS — Z79899 Other long term (current) drug therapy: Secondary | ICD-10-CM | POA: Diagnosis not present

## 2018-07-11 DIAGNOSIS — M25511 Pain in right shoulder: Secondary | ICD-10-CM | POA: Diagnosis not present

## 2018-07-11 DIAGNOSIS — M352 Behcet's disease: Secondary | ICD-10-CM

## 2018-07-11 DIAGNOSIS — M2550 Pain in unspecified joint: Secondary | ICD-10-CM

## 2018-07-11 DIAGNOSIS — M545 Low back pain, unspecified: Secondary | ICD-10-CM

## 2018-07-11 DIAGNOSIS — G8929 Other chronic pain: Secondary | ICD-10-CM

## 2018-07-11 LAB — COXSACKIE A VIRUS ANTIBODIES
COXSACKIE A9 IGM: NEGATIVE {titer}
Coxsackie A16 IgG: 1:800 {titer} — ABNORMAL HIGH
Coxsackie A16 IgM: NEGATIVE titer
Coxsackie A24 IgG: 1:800 {titer} — ABNORMAL HIGH
Coxsackie A24 IgM: NEGATIVE titer
Coxsackie A7 IgG: 1:800 {titer} — ABNORMAL HIGH
Coxsackie A7 IgM: NEGATIVE titer
Coxsackie A9 IgG: 1:800 {titer} — ABNORMAL HIGH

## 2018-07-11 MED ORDER — COLCHICINE 0.6 MG PO CAPS: 0.6000 mg | ORAL_CAPSULE | Freq: Two times a day (BID) | ORAL | 5 refills | Status: DC | PRN

## 2018-07-11 NOTE — Progress Notes (Signed)
Office Visit Note  Patient: Cristian Weiss             Date of Birth: 1991-04-16           MRN: [id number]             PCP: Karlene Einstein, MD Referring: Karlene Einstein, MD Visit Date: 07/11/2018 Occupation: @GUAROCC @  Subjective:  Rash and joint pain.   History of Present Illness: Cristian Weiss is a 28 y.o. male seen in consultation after recent hospitalization.  According to patient his symptoms started about 3 weeks ago with throat pain.  At the time he went to urgent care and was diagnosed with tonsillitis.  He was given clindamycin and prednisone taper starting at 50 mg.  He was also given Chloraseptic throat spray.  He states 3 days later his symptoms improved but then the throat pain recurred.  He was given cough drops, throat spray and another prednisone taper starting at 60 mg tapered by 20 mg every 3 days.  He states while he was on 20 mg a day he developed rash all over his body at the time he stopped the prednisone presuming it was a reaction to prednisone.  He also developed oral ulcers.  He states the oral ulcers get severe enough that he was having difficulty swallowing or drinking fluids.  He went to see his PCP who did some blood work and then referred him to dermatology where he had a skin biopsy.  The skin biopsy results are pending.  On February 8 he was hospitalized with history of fever, joint pain, myalgias, oral ulcers, arthralgias.  He states while he was in the hospital his symptoms got worse with increased joint pain and muscle pain.  He describes joint pain in his shoulders, elbows, hands, knees, hip joints and feet.  He was having difficulty walking and doing routine activities.  He had also severe muscle pain.  He noted a scrotal ulcer on second day of hospitalization.  He also started having eye discomfort and had redness in his eyes.  He did not have ophthalmology evaluation in the hospital.  He was given IV Solu-Medrol first dose on July 09, 2018 and a second  dose on July 10, 2018.  He was discharged on February 10 after Solu-Medrol, on prednisone 60 mg p.o. daily.  He states he still have significant stiffness in the morning and discomfort in his joints especially in his shoulders and knees.  He denies any joint swelling.  He had appointment with Dr. Wyatt Portela and the eye exam was normal.  His eye symptoms have improved after being on Solu-Medrol.  His rash is improved.  He has noticed improvement in his oral ulcers.  He states his scrotal ulcer is not painful anymore.  He has not had any fever for the last 3 days.  He denies any night sweats now.  Activities of Daily Living:  Patient reports morning stiffness for 10 minutes.   Patient Denies nocturnal pain.  Difficulty dressing/grooming: Denies Difficulty climbing stairs: Denies Difficulty getting out of chair: Denies Difficulty using hands for taps, buttons, cutlery, and/or writing: Denies  Review of Systems  Constitutional: Positive for fatigue and night sweats.  HENT: Positive for mouth sores, sore throat, trouble swallowing, trouble swallowing, mouth dryness and sore tongue. Negative for nose dryness.   Eyes: Positive for dryness. Negative for redness.  Respiratory: Negative for shortness of breath and difficulty breathing.   Cardiovascular: Negative for chest pain, palpitations, hypertension,  irregular heartbeat and swelling in legs/feet.  Gastrointestinal: Negative for constipation and diarrhea.  Endocrine: Positive for excessive thirst. Negative for increased urination.  Genitourinary: Negative for difficulty urinating.  Musculoskeletal: Positive for arthralgias, joint pain, myalgias, morning stiffness and myalgias. Negative for joint swelling, muscle weakness and muscle tenderness.  Skin: Positive for rash and ulcers. Negative for color change, hair loss, nodules/bumps, redness, skin tightness and sensitivity to sunlight.  Allergic/Immunologic: Negative for susceptible to infections.    Neurological: Positive for night sweats. Negative for dizziness, fainting, headaches, memory loss and weakness.  Hematological: Negative for bruising/bleeding tendency and swollen glands.  Psychiatric/Behavioral: Positive for sleep disturbance. Negative for depressed mood. The patient is not nervous/anxious.     PMFS History:  Patient Active Problem List   Diagnosis Date Noted  . Oral ulceration 07/08/2018  . Pharyngitis   . Fever 07/07/2018  . Maculopapular rash, generalized 07/07/2018  . SIRS (systemic inflammatory response syndrome) ([place]) 07/07/2018    Past Medical History:  Diagnosis Date  . ADHD (attention deficit hyperactivity disorder)   . Dyslexia     Family History  Problem Relation Age of Onset  . Diabetes Mellitus II Father    Past Surgical History:  Procedure Laterality Date  . HAND SURGERY Right    Social History   Social History Narrative  . Not on file   Immunization History  Administered Date(s) Administered  . Td 12/28/2015     Objective: Vital Signs: BP 110/61 (BP Location: Right Arm, Patient Position: Sitting, Cuff Size: Normal)   Pulse (!) 57   Resp 14   Ht 6' 3"  (1.905 m)   Wt 179 lb 12.8 oz (81.6 kg)   BMI 22.47 kg/m    Physical Exam Vitals signs and nursing note reviewed.  Constitutional:      Appearance: He is well-developed.  HENT:     Head: Normocephalic and atraumatic.     Mouth/Throat:     Comments: Bilateral parotid fullness was noted but no tenderness was noted.  Oral ulcers were noted inside the mouth. Eyes:     Conjunctiva/sclera: Conjunctivae normal.     Pupils: Pupils are equal, round, and reactive to light.  Neck:     Musculoskeletal: Normal range of motion and neck supple.  Cardiovascular:     Rate and Rhythm: Normal rate and regular rhythm.     Heart sounds: Normal heart sounds.  Pulmonary:     Effort: Pulmonary effort is normal.     Breath sounds: Normal breath sounds.  Abdominal:     General: Bowel sounds are  normal.     Palpations: Abdomen is soft.  Skin:    General: Skin is warm and dry.     Capillary Refill: Capillary refill takes less than 2 seconds.     Findings: Rash present.     Comments: He had hyperpigmentation from previous rash and few maculopapular lesions on bilateral lower extremities and upper extremities.  He also had some acneform lesions on his face.  Neurological:     Mental Status: He is alert and oriented to person, place, and time.  Psychiatric:        Behavior: Behavior normal.      Musculoskeletal Exam: C-spine thoracic lumbar spine good range of motion.  He has some discomfort range of motion of his lumbar spine.  Shoulder joints had some discomfort with range of motion.  Elbow joints wrist joint MCPs PIPs DIPs with good range of motion with no synovitis.  Hip joints knee  joints ankles MTPs PIPs with good range of motion with no synovitis.  CDAI Exam: CDAI Score: Not documented Patient Global Assessment: Not documented; Provider Global Assessment: Not documented Swollen: Not documented; Tender: Not documented Joint Exam   Not documented   There is currently no information documented on the homunculus. Go to the Rheumatology activity and complete the homunculus joint exam.  Investigation: No additional findings.  Imaging: Dg Chest 2 View  Result Date: 07/07/2018 CLINICAL DATA:  28 y/o  M; fever, cough, and aches for 3 weeks. EXAM: CHEST - 2 VIEW COMPARISON:  04/09/2016 chest radiograph. FINDINGS: Stable heart size and mediastinal contours are within normal limits. Both lungs are clear. The visualized skeletal structures are unremarkable. IMPRESSION: No acute pulmonary process identified. Electronically Signed   By: Kristine Garbe M.D.   On: 07/07/2018 21:35    Recent Labs: Lab Results  Component Value Date   WBC 10.5 07/08/2018   HGB 14.6 07/08/2018   PLT 205 07/08/2018   NA 139 07/08/2018   K 4.5 07/08/2018   CL 103 07/08/2018   CO2 26  07/08/2018   GLUCOSE 101 (H) 07/08/2018   BUN 13 07/08/2018   CREATININE 0.94 07/08/2018   BILITOT 1.3 (H) 07/07/2018   ALKPHOS 47 07/07/2018   AST 19 07/07/2018   ALT 27 07/07/2018   PROT 8.0 07/07/2018   ALBUMIN 4.0 07/07/2018   CALCIUM 8.6 (L) 07/08/2018   GFRAA >60 07/08/2018  July 07, 2018 HIV negative, CK 42, hepatitis B-, hepatitis C negative, CRP 8.4, ESR 14, ANA negative  Speciality Comments: No specialty comments available.  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  Patient had good response to oral prednisone in the beginning and then IV Solu-Medrol in the hospital.  He has noticed improvement in his rash and oral ulcers.  I will place him on Mitigare 0.6 mg p.o. daily.  He will continue prednisone 60 mg p.o. for 2 weeks.  Plan to taper him by 5 mg if his symptoms improve.  We discussed different treatment options for immunosuppressive agents.  As he has aggressive disease we discussed adding Imuran as a steroid sparing agent.  Indications side effects contraindications were discussed.  Handout was given.  Consent was taken.  I plan to start him on Imuran 50 mg p.o. daily.  If labs are stable in 2 weeks we will increase dose 200 mg p.o. daily.  Labs will be monitored every 2 weeks x 2 and then every 2 months.- Plan: Pan-ANCA  Medication counseling:  TPMT: pending  Patient was counseled on the purpose, proper use, and adverse effects of azathioprine including risk of infection, nausea, rash, and hair loss.  Reviewed risk of cancer after long term use.  Discussed risk of myelosupression and reviewed importance of frequent lab work to monitor blood counts.  Reviewed drug-drug interactions including contraindication with allopurinol.  Provided patient with educational materials on  azathioprine and answered all questions.  Patient consented to azathioprine.  Will upload consent into the media tab.    Polyarthralgia -he still continues to have some stiffness and joint pain.  He states his joint pain was much worse in the hospital.  He denies any history of joint swelling.  He also gives history of chronic shoulder pain from playing baseball in the past.  He has lower back  pain.  Plan: Cyclic citrul peptide antibody, IgG, Rheumatoid factor, HLA-B27 antigen  High risk medication use -chest x-ray, HIV, hepatitis panel was negative in the hospital.  Plan: QuantiFERON-TB Gold Plus, Serum protein electrophoresis with reflex, IgG, IgA, IgM, Thiopurine methyltransferase(tpmt)rbc, VITAMIN D 25 Hydroxy (Vit-D Deficiency, Fractures)  Chronic right shoulder pain-secondary to sports injury.  Chronic midline low back pain without sciatica -he does not have any radiculopathy at this time.  Orders: Orders Placed This Encounter  Procedures  . QuantiFERON-TB Gold Plus  . Serum protein electrophoresis with reflex  . IgG, IgA, IgM  . Thiopurine methyltransferase(tpmt)rbc  . Pan-ANCA  . VITAMIN D 25 Hydroxy (Vit-D Deficiency, Fractures)  . Cyclic citrul peptide antibody, IgG  . Rheumatoid factor  . HLA-B27 antigen   Meds ordered this encounter  Medications  . Colchicine (MITIGARE) 0.6 MG CAPS    Sig: Take 0.6 mg by mouth 2 (two) times daily as needed.    Dispense:  60 capsule    Refill:  5      Follow-Up Instructions: Return in about 10 days (around 07/21/2018) for Behcets syndrome.   Bo Merino, MD  Note - This record has been created using Editor, commissioning.  Chart creation errors have been sought, but may not always  have been located. Such creation errors do not reflect on  the standard of medical care.

## 2018-07-11 NOTE — Patient Instructions (Addendum)
Azathioprine tablets What is this medicine? AZATHIOPRINE (ay za THYE oh preen) suppresses the immune system. It is used to prevent organ rejection after a transplant. It is also used to treat rheumatoid arthritis. This medicine may be used for other purposes; ask your health care provider or pharmacist if you have questions. COMMON BRAND NAME(S): Azasan, Imuran What should I tell my health care provider before I take this medicine? They need to know if you have any of these conditions: -infection -kidney disease -liver disease -an unusual or allergic reaction to azathioprine, other medicines, lactose, foods, dyes, or preservatives -pregnant or trying to get pregnant -breast feeding How should I use this medicine? Take this medicine by mouth with a full glass of water. Follow the directions on the prescription label. Take your medicine at regular intervals. Do not take your medicine more often than directed. Continue to take your medicine even if you feel better. Do not stop taking except on your doctor's advice. Talk to your pediatrician regarding the use of this medicine in children. Special care may be needed. Overdosage: If you think you have taken too much of this medicine contact a poison control center or emergency room at once. NOTE: This medicine is only for you. Do not share this medicine with others. What if I miss a dose? If you miss a dose, take it as soon as you can. If it is almost time for your next dose, take only that dose. Do not take double or extra doses. What may interact with this medicine? Do not take this medicine with any of the following medications: -febuxostat -mercaptopurine This medicine may also interact with the following medications: -allopurinol -aminosalicylates like sulfasalazine, mesalamine, balsalazide, and olsalazine -leflunomide -medicines called ACE inhibitors like benazepril, captopril, enalapril, fosinopril, quinapril, lisinopril, ramipril, and  trandolapril -mycophenolate -sulfamethoxazole; trimethoprim -vaccines -warfarin This list may not describe all possible interactions. Give your health care provider a list of all the medicines, herbs, non-prescription drugs, or dietary supplements you use. Also tell them if you smoke, drink alcohol, or use illegal drugs. Some items may interact with your medicine. What should I watch for while using this medicine? Visit your doctor or health care professional for regular checks on your progress. You will need frequent blood checks during the first few months you are receiving the medicine. If you get a cold or other infection while receiving this medicine, call your doctor or health care professional. Do not treat yourself. The medicine may increase your risk of getting an infection. Women should inform their doctor if they wish to become pregnant or think they might be pregnant. There is a potential for serious side effects to an unborn child. Talk to your health care professional or pharmacist for more information. Men may have a reduced sperm count while they are taking this medicine. Talk to your health care professional for more information. This medicine may increase your risk of getting certain kinds of cancer. Talk to your doctor about healthy lifestyle choices, important screenings, and your risk. What side effects may I notice from receiving this medicine? Side effects that you should report to your doctor or health care professional as soon as possible: -allergic reactions like skin rash, itching or hives, swelling of the face, lips, or tongue -changes in vision -confusion -fever, chills, or any other sign of infection -loss of balance or coordination -severe stomach pain -unusual bleeding, bruising -unusually weak or tired -vomiting -yellowing of the eyes or skin Side effects that usually do   not require medical attention (report to your doctor or health care professional if they  continue or are bothersome): -hair loss -nausea This list may not describe all possible side effects. Call your doctor for medical advice about side effects. You may report side effects to FDA at 1-800-FDA-1088. Where should I keep my medicine? Keep out of the reach of children. Store at room temperature between 15 and 25 degrees C (59 and 77 degrees F). Protect from light. Throw away any unused medicine after the expiration date. NOTE: This sheet is a summary. It may not cover all possible information. If you have questions about this medicine, talk to your doctor, pharmacist, or health care provider.  2019 Elsevier/Gold Standard (2013-09-11 12:00:31) Standing Labs We placed an order today for your standing lab work.    Please come back and get your standing labs in 2 weeks x 2 after starting Imuran then every 2 months.  We have open lab Monday through Friday from 8:30-11:30 AM and 1:30-4:00 PM  at the office of Dr. Pollyann Savoy.   You may experience shorter wait times on Monday and Friday afternoons. The office is located at [address], [place], [place] [address] No appointment is necessary.   Labs are drawn by [facility].  You may receive a bill from [facility] for your lab work.  If you wish to have your labs drawn at another location, please call the office 24 hours in advance to send orders.  If you have any questions regarding directions or hours of operation,  please call [phone].   Just as a reminder please drink plenty of water prior to coming for your lab work. Thanks!

## 2018-07-12 LAB — RPR: RPR Ser Ql: NONREACTIVE

## 2018-07-12 LAB — COXSACKIE B VIRUS ANTIBODIES
Coxsackie B1 Ab: NEGATIVE
Coxsackie B2 Ab: NEGATIVE
Coxsackie B3 Ab: NEGATIVE
Coxsackie B4 Ab: NEGATIVE
Coxsackie B5 Ab: 1:16 {titer} — ABNORMAL HIGH
Coxsackie B6 Ab: 1:16 {titer} — ABNORMAL HIGH

## 2018-07-13 LAB — CULTURE, BLOOD (ROUTINE X 2)
Culture: NO GROWTH
Culture: NO GROWTH

## 2018-07-13 LAB — CRYOGLOBULIN

## 2018-07-14 MED FILL — predniSONE 20 MG TABS: 20 | 5 days supply | Qty: 15 | Fill #1

## 2018-07-15 LAB — IGG, IGA, IGM
IgG (Immunoglobin G), Serum: 1357 mg/dL (ref 600–1640)
IgM, Serum: 126 mg/dL (ref 50–300)
Immunoglobulin A: 371 mg/dL — ABNORMAL HIGH (ref 47–310)

## 2018-07-15 LAB — PROTEIN ELECTROPHORESIS, SERUM, WITH REFLEX
Albumin ELP: 3.8 g/dL (ref 3.8–4.8)
Alpha 1: 0.4 g/dL — ABNORMAL HIGH (ref 0.2–0.3)
Alpha 2: 0.8 g/dL (ref 0.5–0.9)
BETA GLOBULIN: 0.4 g/dL (ref 0.4–0.6)
Beta 2: 0.4 g/dL (ref 0.2–0.5)
Gamma Globulin: 1.2 g/dL (ref 0.8–1.7)
Total Protein: 7 g/dL (ref 6.1–8.1)

## 2018-07-15 LAB — QUANTIFERON-TB GOLD PLUS
Mitogen-NIL: 1.7 IU/mL
NIL: 0.01 IU/mL
QuantiFERON-TB Gold Plus: NEGATIVE
TB1-NIL: 0 IU/mL
TB2-NIL: 0 IU/mL

## 2018-07-15 LAB — CYCLIC CITRUL PEPTIDE ANTIBODY, IGG: Cyclic Citrullin Peptide Ab: <16 UNITS

## 2018-07-15 LAB — PAN-ANCA
ANCA Screen: NEGATIVE
Myeloperoxidase Abs: <1 AI
Serine Protease 3: <1 AI

## 2018-07-15 LAB — VITAMIN D 25 HYDROXY (VIT D DEFICIENCY, FRACTURES): Vit D, 25-Hydroxy: 16 ng/mL — ABNORMAL LOW (ref 30–100)

## 2018-07-15 LAB — HLA-B27 ANTIGEN: HLA-B27 ANTIGEN: NEGATIVE

## 2018-07-15 LAB — RHEUMATOID FACTOR: Rheumatoid fact SerPl-aCnc: <14 IU/mL (ref <14)

## 2018-07-15 LAB — THIOPURINE METHYLTRANSFERASE (TPMT), RBC: Thiopurine Methyltransferase, RBC: 14 nmol/hr/mL RBC

## 2018-07-17 ENCOUNTER — Telehealth: Payer: Self-pay | Admitting: *Deleted

## 2018-07-17 DIAGNOSIS — M25511 Pain in right shoulder: Secondary | ICD-10-CM

## 2018-07-17 DIAGNOSIS — E559 Vitamin D deficiency, unspecified: Secondary | ICD-10-CM | POA: Insufficient documentation

## 2018-07-17 DIAGNOSIS — M545 Low back pain: Secondary | ICD-10-CM

## 2018-07-17 DIAGNOSIS — G8929 Other chronic pain: Secondary | ICD-10-CM | POA: Insufficient documentation

## 2018-07-17 DIAGNOSIS — Z7952 Long term (current) use of systemic steroids: Secondary | ICD-10-CM | POA: Insufficient documentation

## 2018-07-17 MED ORDER — AZATHIOPRINE 50 MG PO TABS: ORAL_TABLET | ORAL | 0 refills | Status: DC

## 2018-07-17 MED FILL — azaTHIOprine 50 MG TABS: 50 | 52 days supply | Qty: 90 | Fill #0

## 2018-07-17 NOTE — Telephone Encounter (Signed)
-----   Message from Pollyann Savoy, MD sent at 07/17/2018  9:23 AM EST ----- All labs are back now.  T PMT is negative.  Okay to restart patient on Imuran 50 mg p.o. daily.  He will need labs in 2 weeks which will include CBC and CMP.

## 2018-07-17 NOTE — Progress Notes (Signed)
All labs are back now.  T PMT is negative.  Okay to restart patient on Imuran 50 mg p.o. daily.  He will need labs in 2 weeks which will include CBC and CMP.

## 2018-07-17 NOTE — Progress Notes (Signed)
Office Visit Note  Patient: Cristian Weiss             Date of Birth: 1990-10-15           MRN: [id number]             PCP: Karlene Einstein, MD Referring: Karlene Einstein, MD Visit Date: 07/19/2018 Occupation: @GUAROCC @  Subjective:  Fatigue   History of Present Illness: Cristian Weiss is a 28 y.o. 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  He started Imuran on July 17, 2018.  He is taking Imuran 50 mg po daily for 2 weeks and then will increase to 100 mg po daily.  He continues to take prednisone 60 mg po daily.  He is taking colchicine 0.6 po daily.  He is taking vitamin D 50,000 units by mouth twice weekly.  His myalgias have resolved since taking vitamin D.  He reports that his fatigue has improved.  He reports that he is back to work and does feel tired by the end of the day since he has a strenuous job.  He reports he continues to have pain in bilateral knee joints but denies any joint swelling.  He continues to have chronic pain in his lower back but denies any symptoms of radiculopathy.  He reports that his rash is improved significantly.  He continues to have sores on inner aspect of his lips but reports they are healing.  He continues to use Chloraseptic as needed.  He reports that he continues to have intermittent photophobia and eye pain.  He states that the pain is fleeting.  He denies any eye dryness and does not use any over-the-counter eyedrops.    Activities of Daily Living:  Patient reports morning stiffness for 2 hours.   Patient Reports nocturnal pain.  Difficulty dressing/grooming: Denies Difficulty climbing stairs: Denies Difficulty getting out of chair: Denies Difficulty using hands for taps, buttons, cutlery, and/or writing: Denies  Review of Systems  Constitutional: Positive for fatigue. Negative for night sweats.  HENT: Positive for mouth sores. Negative for mouth dryness and nose dryness.   Eyes: Positive for photophobia. Negative for pain, redness and  dryness.  Respiratory: Negative for cough, hemoptysis, shortness of breath and difficulty breathing.   Cardiovascular: Negative for chest pain, palpitations, hypertension, irregular heartbeat and swelling in legs/feet.  Gastrointestinal: Negative for blood in stool, constipation and diarrhea.  Endocrine: Negative for increased urination.  Genitourinary: Negative for difficulty urinating and painful urination.  Musculoskeletal: Positive for arthralgias, joint pain and morning stiffness. Negative for joint swelling, myalgias, muscle weakness, muscle tenderness and myalgias.  Skin: Positive for rash. Negative for color change, hair loss, nodules/bumps, skin tightness, ulcers and sensitivity to sunlight.  Allergic/Immunologic: Negative for susceptible to infections.  Neurological: Negative for dizziness, fainting, memory loss, night sweats and weakness ( ).  Hematological: Negative for bruising/bleeding tendency and swollen glands.  Psychiatric/Behavioral: Positive for sleep disturbance. Negative for depressed mood. The patient is not nervous/anxious.     PMFS History:  Patient Active Problem List   Diagnosis Date Noted  . Vitamin D deficiency 07/17/2018  . Long term (current) use of systemic steroids 07/17/2018  . Chronic right shoulder pain 07/17/2018  . Chronic midline low back pain without sciatica 07/17/2018  . Oral ulceration 07/08/2018  . Pharyngitis   . Fever 07/07/2018  . Maculopapular rash, generalized 07/07/2018  . SIRS (systemic inflammatory response syndrome) ([place]) 07/07/2018    Past Medical History:  Diagnosis Date  . ADHD (attention deficit  hyperactivity disorder)   . Behcet's disease ([place])   . Dyslexia     Family History  Problem Relation Age of Onset  . Diabetes Mellitus II Father    Past Surgical History:  Procedure Laterality Date  . HAND SURGERY Right    Social History   Social History Narrative  . Not on file   Immunization History  Administered Date(s)  Administered  . Td 12/28/2015     Objective: Vital Signs: BP 135/82 (BP Location: Left Arm, Patient Position: Sitting, Cuff Size: Normal)   Pulse 88   Resp 14   Ht 6' 3"  (1.905 m)   Wt 181 lb (82.1 kg)   BMI 22.62 kg/m    Physical Exam Vitals signs and nursing note reviewed.  Constitutional:      Appearance: He is well-developed.  HENT:     Head: Normocephalic and atraumatic.     Mouth/Throat:     Comments: 2 small oral ulcers were noted in the lower  inner lip line. Eyes:     Conjunctiva/sclera: Conjunctivae normal.     Pupils: Pupils are equal, round, and reactive to light.  Neck:     Musculoskeletal: Normal range of motion and neck supple.  Cardiovascular:     Rate and Rhythm: Normal rate and regular rhythm.     Heart sounds: Normal heart sounds.  Pulmonary:     Effort: Pulmonary effort is normal.     Breath sounds: Normal breath sounds.  Abdominal:     General: Bowel sounds are normal.     Palpations: Abdomen is soft.  Lymphadenopathy:     Cervical: No cervical adenopathy.  Skin:    General: Skin is warm and dry.     Capillary Refill: Capillary refill takes less than 2 seconds.     Comments: Fine acne noted on chest and back.  Rash on extremities is completely resolved he has some residual hyperpigmentation on his knees.  Neurological:     Mental Status: He is alert and oriented to person, place, and time.  Psychiatric:        Behavior: Behavior normal.      Musculoskeletal Exam: C-spine good range of motion.  Some discomfort range of motion of his thoracic lumbar spine.  He had discomfort range of motion of his shoulders.,  Elbow joints, wrist joints, MCPs PIPs DIPs with good range of motion.  Good range of motion wrist hip joints.  Crepitus noted in bilateral knee joints.  Ankle joints, MTPs PIPs and DIPs with good range of motion with no synovitis.  CDAI Exam: CDAI Score: Not documented Patient Global Assessment: Not documented; Provider Global Assessment:  Not documented Swollen: Not documented; Tender: Not documented Joint Exam   Not documented   There is currently no information documented on the homunculus. Go to the Rheumatology activity and complete the homunculus joint exam.  Investigation: No additional findings.  Imaging: Dg Chest 2 View  Result Date: 07/07/2018 CLINICAL DATA:  28 y/o  M; fever, cough, and aches for 3 weeks. EXAM: CHEST - 2 VIEW COMPARISON:  04/09/2016 chest radiograph. FINDINGS: Stable heart size and mediastinal contours are within normal limits. Both lungs are clear. The visualized skeletal structures are unremarkable. IMPRESSION: No acute pulmonary process identified. Electronically Signed   By: Kristine Garbe M.D.   On: 07/07/2018 21:35   Xr Knee 3 View Left  Result Date: 07/19/2018 Mild medial compartment narrowing was noted.  Mild patellofemoral narrowing was noted.  No chondrocalcinosis was noted. Impression: These  findings are consistent with mild osteoarthritis and mild chondromalacia patella.  Xr Knee 3 View Right  Result Date: 07/19/2018 Mild medial compartment narrowing was noted.  No patellofemoral narrowing was noted.  No chondrocalcinosis was noted. Impression: These findings are consistent with mild osteoarthritis of the knee joint.  Xr Lumbar Spine 2-3 Views  Result Date: 07/19/2018 No significant disc space narrowing was noted.  No SI joint narrowing or sclerosis was noted.  Facet joint arthropathy was noted. Impression: These findings are consistent with facet joint arthropathy.   Recent Labs: Lab Results  Component Value Date   WBC 10.5 07/08/2018   HGB 14.6 07/08/2018   PLT 205 07/08/2018   NA 139 07/08/2018   K 4.5 07/08/2018   CL 103 07/08/2018   CO2 26 07/08/2018   GLUCOSE 101 (H) 07/08/2018   BUN 13 07/08/2018   CREATININE 0.94 07/08/2018   BILITOT 1.3 (H) 07/07/2018   ALKPHOS 47 07/07/2018   AST 19 07/07/2018   ALT 27 07/07/2018   PROT 7.0 07/11/2018   ALBUMIN  4.0 07/07/2018   CALCIUM 8.6 (L) 07/08/2018   GFRAA >60 07/08/2018   QFTBGOLDPLUS NEGATIVE 07/11/2018  SPEP nonspecific, TB: Negative, immunoglobulins normal, ANCA negative, T PMT normal, RF negative, anti-CCP negative, HLA-B27 negative, vitamin D 16  Speciality Comments: No specialty comments available.  Procedures:  No procedures performed Allergies: Amoxicillin and Penicillins   Assessment / Plan:     Visit Diagnoses: Behcet's disease ([place]) - History of oral ulcers, genital ulcers, hives, pathergy, fever, myalgias possible eye involvement. -He is currently on Imuran 50 mg p.o. daily, prednisone 60 mg p.o. daily and colchicine 0.6 mg p.o. daily.  He has noticed improvement in his symptoms overall.  The rash is resolved.  He has not noticed any new lesions.  He has developed some acne on his chest and back from prednisone use.  The oral ulcers have improved on colchicine.  We had detailed discussion regarding long-term plan.  More counseling regarding behcet's was provided.  My plan is to keep her on Imuran 50 mg p.o. daily for 2 weeks and then increase to 200 mg p.o. daily.  Will taper prednisone to 55 mg on February 24 for next 2 weeks.  If he does well I may consider tapering it further down to 50 mg p.o. daily.  Have advised him to check glucometer readings at home twice a week.  He will get labs in 2 weeks after starting Imuran and then 2 weeks again.  Then we will monitor labs every 2 months.  Plan: predniSONE (DELTASONE) 10 MG tablet  High risk medication use-Imuran 50 mg p.o. daily.  Long term (current) use of systemic steroids-he is currently on prednisone 60 mg p.o. daily.  Maculopapular rash, generalized-improved on prednisone.  Vitamin D deficiency-he is currently on vitamin D supplement.  Chronic right shoulder pain - Secondary to sports injury  Chronic midline low back pain without sciatica -he has had lower back pain for few years.  Plan: XR Lumbar Spine 2-3 Views.  The  x-rays of the lumbar spine were consistent with facet joint arthropathy.  A handout on back exercises was given..  Chronic pain of both knees -he complains of discomfort in his bilateral knee joints.  X-rays were consistent mild osteoarthritis and mild chondromalacia patella.  A handout on knee exercises was given.  Plan: XR KNEE 3 VIEW LEFT, XR KNEE 3 VIEW RIGHT   Photophobia-he has been experiencing ocular photophobia.  We will ask refer him  to ophthalmology.  Orders: Orders Placed This Encounter  Procedures  . XR Lumbar Spine 2-3 Views  . XR KNEE 3 VIEW LEFT  . XR KNEE 3 VIEW RIGHT   Meds ordered this encounter  Medications  . predniSONE (DELTASONE) 10 MG tablet    Sig: Take 6 tablets (60 mg total) by mouth daily with breakfast for 30 days.    Dispense:  180 tablet    Refill:  0      Follow-Up Instructions: Return in about 4 weeks (around 08/16/2018) for Behcet's disease.   Bo Merino, MD  Note - This record has been created using Editor, commissioning.  Chart creation errors have been sought, but may not always  have been located. Such creation errors do not reflect on  the standard of medical care.

## 2018-07-19 ENCOUNTER — Ambulatory Visit (INDEPENDENT_AMBULATORY_CARE_PROVIDER_SITE_OTHER): Payer: Self-pay

## 2018-07-19 ENCOUNTER — Encounter: Payer: Self-pay | Admitting: Rheumatology

## 2018-07-19 ENCOUNTER — Ambulatory Visit: Payer: BLUE CROSS/BLUE SHIELD | Admitting: Rheumatology

## 2018-07-19 VITALS — BP 135/82 | HR 88 | Resp 14 | Ht 75.0 in | Wt 181.0 lb

## 2018-07-19 DIAGNOSIS — Z7952 Long term (current) use of systemic steroids: Secondary | ICD-10-CM | POA: Diagnosis not present

## 2018-07-19 DIAGNOSIS — M545 Low back pain: Secondary | ICD-10-CM | POA: Diagnosis not present

## 2018-07-19 DIAGNOSIS — M25511 Pain in right shoulder: Secondary | ICD-10-CM

## 2018-07-19 DIAGNOSIS — R21 Rash and other nonspecific skin eruption: Secondary | ICD-10-CM | POA: Diagnosis not present

## 2018-07-19 DIAGNOSIS — H53149 Visual discomfort, unspecified: Secondary | ICD-10-CM

## 2018-07-19 DIAGNOSIS — M25562 Pain in left knee: Secondary | ICD-10-CM | POA: Diagnosis not present

## 2018-07-19 DIAGNOSIS — M25561 Pain in right knee: Secondary | ICD-10-CM

## 2018-07-19 DIAGNOSIS — Z79899 Other long term (current) drug therapy: Secondary | ICD-10-CM

## 2018-07-19 DIAGNOSIS — G8929 Other chronic pain: Secondary | ICD-10-CM

## 2018-07-19 DIAGNOSIS — M352 Behcet's disease: Secondary | ICD-10-CM | POA: Diagnosis not present

## 2018-07-19 DIAGNOSIS — E559 Vitamin D deficiency, unspecified: Secondary | ICD-10-CM

## 2018-07-19 MED ORDER — PREDNISONE 10 MG PO TABS: 60.0000 mg | ORAL_TABLET | Freq: Every day | ORAL | 0 refills | Status: DC

## 2018-07-19 MED FILL — predniSONE 10 MG TABS: 10 | 30 days supply | Qty: 180 | Fill #0

## 2018-07-19 NOTE — Patient Instructions (Signed)
Decrease prednisone to 55 mg p.o. every morning from July 24, 2018.  Check glucometer reading twice a week.

## 2018-07-20 ENCOUNTER — Other Ambulatory Visit: Payer: Self-pay | Admitting: *Deleted

## 2018-07-20 DIAGNOSIS — H209 Unspecified iridocyclitis: Secondary | ICD-10-CM

## 2018-07-21 ENCOUNTER — Ambulatory Visit: Payer: Self-pay | Admitting: Rheumatology

## 2018-07-24 ENCOUNTER — Ambulatory Visit: Payer: Self-pay | Admitting: Rheumatology

## 2018-07-31 ENCOUNTER — Other Ambulatory Visit: Payer: Self-pay | Admitting: *Deleted

## 2018-07-31 DIAGNOSIS — Z79899 Other long term (current) drug therapy: Secondary | ICD-10-CM

## 2018-07-31 LAB — COMPLETE METABOLIC PANEL WITH GFR
AG Ratio: 1.7 (calc) (ref 1.0–2.5)
ALT: 41 U/L (ref 9–46)
AST: 17 U/L (ref 10–40)
Albumin: 4.7 g/dL (ref 3.6–5.1)
Alkaline phosphatase (APISO): 46 U/L (ref 36–130)
BILIRUBIN TOTAL: 0.5 mg/dL (ref 0.2–1.2)
BUN: 15 mg/dL (ref 7–25)
CO2: 29 mmol/L (ref 20–32)
Calcium: 9.7 mg/dL (ref 8.6–10.3)
Chloride: 98 mmol/L (ref 98–110)
Creat: 0.87 mg/dL (ref 0.60–1.35)
GFR, Est African American: 137 mL/min/{1.73_m2} (ref >=60)
GFR, Est Non African American: 118 mL/min/{1.73_m2} (ref >=60)
Globulin: 2.8 g/dL (calc) (ref 1.9–3.7)
Glucose, Bld: 113 mg/dL (ref 65–139)
Potassium: 4 mmol/L (ref 3.5–5.3)
Sodium: 135 mmol/L (ref 135–146)
Total Protein: 7.5 g/dL (ref 6.1–8.1)

## 2018-07-31 LAB — CBC WITH DIFFERENTIAL/PLATELET
Absolute Monocytes: 388 cells/uL (ref 200–950)
Basophils Absolute: 39 cells/uL (ref 0–200)
Basophils Relative: 0.4 %
EOS PCT: 0.4 %
Eosinophils Absolute: 39 cells/uL (ref 15–500)
HCT: 46 % (ref 38.5–50.0)
Hemoglobin: 16 g/dL (ref 13.2–17.1)
Lymphs Abs: 873 cells/uL (ref 850–3900)
MCH: 30.2 pg (ref 27.0–33.0)
MCHC: 34.8 g/dL (ref 32.0–36.0)
MCV: 87 fL (ref 80.0–100.0)
MPV: 9.7 fL (ref 7.5–12.5)
Monocytes Relative: 4 %
NEUTROS PCT: 86.2 %
Neutro Abs: 8361 cells/uL — ABNORMAL HIGH (ref 1500–7800)
Platelets: 225 10*3/uL (ref 140–400)
RBC: 5.29 10*6/uL (ref 4.20–5.80)
RDW: 12.9 % (ref 11.0–15.0)
Total Lymphocyte: 9 %
WBC: 9.7 10*3/uL (ref 3.8–10.8)

## 2018-07-31 NOTE — Progress Notes (Signed)
CMP is normal.

## 2018-08-07 NOTE — Progress Notes (Deleted)
Office Visit Note  Patient: Cristian Weiss             Date of Birth: 03-04-1991           MRN: [id number]             PCP: Jolene Provost, MD Referring: Jolene Provost, MD Visit Date: 08/17/2018 Occupation: @GUAROCC @  Subjective:  No chief complaint on file.  Imuran 50 mg 2 tablets daily.    History of Present Illness: Cristian Weiss is a 28 y.o. male ***   Activities of Daily Living:  Patient reports morning stiffness for *** {minute/hour:19697}.   Patient {ACTIONS;DENIES/REPORTS:21021675::"Denies"} nocturnal pain.  Difficulty dressing/grooming: {ACTIONS;DENIES/REPORTS:21021675::"Denies"} Difficulty climbing stairs: {ACTIONS;DENIES/REPORTS:21021675::"Denies"} Difficulty getting out of chair: {ACTIONS;DENIES/REPORTS:21021675::"Denies"} Difficulty using hands for taps, buttons, cutlery, and/or writing: {ACTIONS;DENIES/REPORTS:21021675::"Denies"}  No Rheumatology ROS completed.   PMFS History:  Patient Active Problem List   Diagnosis Date Noted  . Vitamin D deficiency 07/17/2018  . Long term (current) use of systemic steroids 07/17/2018  . Chronic right shoulder pain 07/17/2018  . Chronic midline low back pain without sciatica 07/17/2018  . Oral ulceration 07/08/2018  . Pharyngitis   . Fever 07/07/2018  . Maculopapular rash, generalized 07/07/2018  . SIRS (systemic inflammatory response syndrome) ([facility]) 07/07/2018    Past Medical History:  Diagnosis Date  . ADHD (attention deficit hyperactivity disorder)   . Behcet's disease ([facility])   . Dyslexia     Family History  Problem Relation Age of Onset  . Diabetes Mellitus II Father    Past Surgical History:  Procedure Laterality Date  . HAND SURGERY Right    Social History   Social History Narrative  . Not on file   Immunization History  Administered Date(s) Administered  . Td 12/28/2015     Objective: Vital Signs: There were no vitals taken for this visit.   Physical Exam   Musculoskeletal Exam:  ***  CDAI Exam: CDAI Score: Not documented Patient Global Assessment: Not documented; Provider Global Assessment: Not documented Swollen: Not documented; Tender: Not documented Joint Exam   Not documented   There is currently no information documented on the homunculus. Go to the Rheumatology activity and complete the homunculus joint exam.  Investigation: No additional findings.  Imaging: Xr Knee 3 View Left  Result Date: 07/19/2018 Mild medial compartment narrowing was noted.  Mild patellofemoral narrowing was noted.  No chondrocalcinosis was noted. Impression: These findings are consistent with mild osteoarthritis and mild chondromalacia patella.  Xr Knee 3 View Right  Result Date: 07/19/2018 Mild medial compartment narrowing was noted.  No patellofemoral narrowing was noted.  No chondrocalcinosis was noted. Impression: These findings are consistent with mild osteoarthritis of the knee joint.  Xr Lumbar Spine 2-3 Views  Result Date: 07/19/2018 No significant disc space narrowing was noted.  No SI joint narrowing or sclerosis was noted.  Facet joint arthropathy was noted. Impression: These findings are consistent with facet joint arthropathy.   Recent Labs: Lab Results  Component Value Date   WBC 9.7 07/31/2018   HGB 16.0 07/31/2018   PLT 225 07/31/2018   NA 135 07/31/2018   K 4.0 07/31/2018   CL 98 07/31/2018   CO2 29 07/31/2018   GLUCOSE 113 07/31/2018   BUN 15 07/31/2018   CREATININE 0.87 07/31/2018   BILITOT 0.5 07/31/2018   ALKPHOS 47 07/07/2018   AST 17 07/31/2018   ALT 41 07/31/2018   PROT 7.5 07/31/2018   ALBUMIN 4.0 07/07/2018   CALCIUM 9.7 07/31/2018  GFRAA 137 07/31/2018   QFTBGOLDPLUS NEGATIVE 07/11/2018    Speciality Comments: No specialty comments available.  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 Dione Booze, Dr. Sherryll Burger   Orders: No orders of the defined types were placed in this encounter.  No orders of the defined types were placed in this encounter.   Face-to-face time spent with patient was *** minutes. Greater than 50% of time was spent in counseling and coordination of care.  Follow-Up Instructions: No follow-ups on file.   Gearldine Bienenstock, PA-C  Note - This record has been created using Dragon software.  Chart creation errors have been sought, but may not always  have been located. Such creation errors do not reflect on  the standard of medical care.

## 2018-08-11 ENCOUNTER — Other Ambulatory Visit: Payer: Self-pay | Admitting: *Deleted

## 2018-08-11 DIAGNOSIS — R252 Cramp and spasm: Secondary | ICD-10-CM

## 2018-08-14 ENCOUNTER — Other Ambulatory Visit: Payer: Self-pay | Admitting: *Deleted

## 2018-08-14 DIAGNOSIS — Z79899 Other long term (current) drug therapy: Secondary | ICD-10-CM

## 2018-08-14 DIAGNOSIS — M352 Behcet's disease: Secondary | ICD-10-CM

## 2018-08-14 DIAGNOSIS — R252 Cramp and spasm: Secondary | ICD-10-CM

## 2018-08-14 LAB — CBC WITH DIFFERENTIAL/PLATELET
Absolute Monocytes: 964 cells/uL — ABNORMAL HIGH (ref 200–950)
Basophils Absolute: 53 cells/uL (ref 0–200)
Basophils Relative: 0.4 %
Eosinophils Absolute: 53 cells/uL (ref 15–500)
Eosinophils Relative: 0.4 %
HCT: 44.5 % (ref 38.5–50.0)
Hemoglobin: 15.4 g/dL (ref 13.2–17.1)
LYMPHS ABS: 2640 {cells}/uL (ref 850–3900)
MCH: 30.2 pg (ref 27.0–33.0)
MCHC: 34.6 g/dL (ref 32.0–36.0)
MCV: 87.3 fL (ref 80.0–100.0)
MPV: 9.9 fL (ref 7.5–12.5)
Monocytes Relative: 7.3 %
Neutro Abs: 9491 cells/uL — ABNORMAL HIGH (ref 1500–7800)
Neutrophils Relative %: 71.9 %
Platelets: 244 10*3/uL (ref 140–400)
RBC: 5.1 10*6/uL (ref 4.20–5.80)
RDW: 13 % (ref 11.0–15.0)
Total Lymphocyte: 20 %
WBC: 13.2 10*3/uL — ABNORMAL HIGH (ref 3.8–10.8)

## 2018-08-14 LAB — COMPLETE METABOLIC PANEL WITH GFR
AG Ratio: 2.3 (calc) (ref 1.0–2.5)
ALBUMIN MSPROF: 4.6 g/dL (ref 3.6–5.1)
ALT: 24 U/L (ref 9–46)
AST: 12 U/L (ref 10–40)
Alkaline phosphatase (APISO): 37 U/L (ref 36–130)
BILIRUBIN TOTAL: 0.4 mg/dL (ref 0.2–1.2)
BUN: 12 mg/dL (ref 7–25)
CO2: 32 mmol/L (ref 20–32)
Calcium: 9.4 mg/dL (ref 8.6–10.3)
Chloride: 101 mmol/L (ref 98–110)
Creat: 0.72 mg/dL (ref 0.60–1.35)
GFR, Est African American: 148 mL/min/{1.73_m2} (ref >=60)
GFR, Est Non African American: 128 mL/min/{1.73_m2} (ref >=60)
Globulin: 2 g/dL (calc) (ref 1.9–3.7)
Glucose, Bld: 91 mg/dL (ref 65–139)
Potassium: 3.8 mmol/L (ref 3.5–5.3)
Sodium: 139 mmol/L (ref 135–146)
Total Protein: 6.6 g/dL (ref 6.1–8.1)

## 2018-08-14 LAB — CK: Total CK: 92 U/L (ref 44–196)

## 2018-08-14 MED ORDER — AZATHIOPRINE 50 MG PO TABS: 100.0000 mg | ORAL_TABLET | Freq: Every day | ORAL | 0 refills | Status: DC

## 2018-08-14 MED ORDER — PREDNISONE 10 MG PO TABS: 50.0000 mg | ORAL_TABLET | Freq: Every day | ORAL | 0 refills | Status: AC

## 2018-08-14 MED FILL — predniSONE 10 MG TABS: 10 | 30 days supply | Qty: 150 | Fill #0

## 2018-08-14 NOTE — Telephone Encounter (Signed)
Patient's wife requested refill on Prednisone and Imuran.   Last Visit: 07/19/18 Next visit: 08/17/18 Labs: 07/31/18 CBC WNL, Neutro Abs 8,361High   Okay to refill per Dr. Corliss Skains

## 2018-08-15 ENCOUNTER — Other Ambulatory Visit: Payer: Self-pay

## 2018-08-15 ENCOUNTER — Ambulatory Visit (INDEPENDENT_AMBULATORY_CARE_PROVIDER_SITE_OTHER): Payer: BLUE CROSS/BLUE SHIELD | Admitting: Rheumatology

## 2018-08-15 ENCOUNTER — Encounter: Payer: Self-pay | Admitting: Rheumatology

## 2018-08-15 VITALS — BP 124/74 | HR 109 | Resp 14 | Ht 75.0 in | Wt 184.6 lb

## 2018-08-15 DIAGNOSIS — Z7952 Long term (current) use of systemic steroids: Secondary | ICD-10-CM

## 2018-08-15 DIAGNOSIS — Z79899 Other long term (current) drug therapy: Secondary | ICD-10-CM | POA: Diagnosis not present

## 2018-08-15 DIAGNOSIS — E559 Vitamin D deficiency, unspecified: Secondary | ICD-10-CM

## 2018-08-15 DIAGNOSIS — M47816 Spondylosis without myelopathy or radiculopathy, lumbar region: Secondary | ICD-10-CM

## 2018-08-15 DIAGNOSIS — R252 Cramp and spasm: Secondary | ICD-10-CM | POA: Diagnosis not present

## 2018-08-15 DIAGNOSIS — H209 Unspecified iridocyclitis: Secondary | ICD-10-CM | POA: Insufficient documentation

## 2018-08-15 DIAGNOSIS — M352 Behcet's disease: Secondary | ICD-10-CM

## 2018-08-15 DIAGNOSIS — M170 Bilateral primary osteoarthritis of knee: Secondary | ICD-10-CM

## 2018-08-15 DIAGNOSIS — R21 Rash and other nonspecific skin eruption: Secondary | ICD-10-CM

## 2018-08-15 MED ORDER — AZATHIOPRINE 50 MG PO TABS: ORAL_TABLET | ORAL | 0 refills | Status: DC

## 2018-08-15 MED FILL — azaTHIOprine 50 MG TABS: 50 | 90 days supply | Qty: 270 | Fill #0

## 2018-08-15 MED FILL — MITIGARE 0.6 MG CAPSULE: 0.6 | 30 days supply | Qty: 60 | Fill #0 | Status: TO

## 2018-08-15 NOTE — Progress Notes (Signed)
Office Visit Note  Patient: Cristian Weiss             Date of Birth: Jul 12, 1990           MRN: [id number]             PCP: Jolene Provost, MD Referring: Jolene Provost, MD Visit Date: 08/15/2018 Occupation: @GUAROCC @  Subjective:  Medication monitoring.   History of Present Illness: Cristian Weiss is a 28 y.o. male with history of Behcet's disease, osteoarthritis and facet joint arthropathy.  He states he is doing quite well on current combination.  He is on prednisone 50 mg p.o. daily currently and Imuran 100 mg p.o. daily.  He has been on Imuran 100 mg p.o. daily for the last 2 weeks.  He denies any rash.  He states he has occasional muscle cramps.  He denies any joint swelling.  Activities of Daily Living:  Patient reports morning stiffness for 10 minutes.   Patient Denies nocturnal pain.  Difficulty dressing/grooming: Denies Difficulty climbing stairs: Denies Difficulty getting out of chair: Denies Difficulty using hands for taps, buttons, cutlery, and/or writing: Denies  Review of Systems  Constitutional: Positive for fatigue. Negative for night sweats.  HENT: Negative for mouth sores, mouth dryness and nose dryness.   Eyes: Negative for redness and dryness.  Respiratory: Negative for shortness of breath and difficulty breathing.   Cardiovascular: Negative for chest pain, palpitations, hypertension, irregular heartbeat and swelling in legs/feet.  Gastrointestinal: Negative for constipation and diarrhea.  Endocrine: Negative for excessive thirst and increased urination.  Genitourinary: Negative for difficulty urinating.  Musculoskeletal: Positive for arthralgias, joint pain, myalgias, morning stiffness and myalgias. Negative for joint swelling, muscle weakness and muscle tenderness.  Skin: Negative for color change, rash, hair loss, nodules/bumps, skin tightness, ulcers and sensitivity to sunlight.  Allergic/Immunologic: Positive for susceptible to infections.   Neurological: Negative for dizziness, fainting, memory loss, night sweats and weakness.  Hematological: Negative for bruising/bleeding tendency and swollen glands.  Psychiatric/Behavioral: Positive for sleep disturbance. Negative for depressed mood. The patient is not nervous/anxious.     PMFS History:  Patient Active Problem List   Diagnosis Date Noted  . 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 Uveitis 08/15/2018  . Primary osteoarthritis of both knees 08/15/2018  . Lumbar facet arthropathy 08/15/2018  . Vitamin D deficiency 07/17/2018  . Long term (current) use of systemic steroids 07/17/2018  . Chronic right shoulder pain 07/17/2018  . Chronic midline low back pain without sciatica 07/17/2018  . Oral ulceration 07/08/2018  . Pharyngitis   . Fever 07/07/2018  . Maculopapular rash, generalized 07/07/2018  . SIRS (systemic inflammatory response syndrome) (HCC) 07/07/2018    Past Medical History:  Diagnosis Date  . ADHD (attention deficit hyperactivity disorder)   . Behcet's disease (HCC)   . Dyslexia     Family History  Problem Relation Age of Onset  . Diabetes Mellitus II Father    Past Surgical History:  Procedure Laterality Date  . HAND SURGERY Right    Social History   Social History Narrative  . Not on file   Immunization History  Administered Date(s) Administered  . Td 12/28/2015     Objective: Vital Signs: BP 124/74 (BP Location: Left Arm, Patient Position: Sitting, Cuff Size: Normal)   Pulse (!) 109   Resp 14   Ht 6\' 3"  (1.905 m)   Wt 184 lb 9.6 oz (83.7 kg)   BMI 23.07 kg/m    Physical Exam Vitals signs and  nursing note reviewed.  Constitutional:      Appearance: He is well-developed.  HENT:     Head: Normocephalic and atraumatic.  Eyes:     Conjunctiva/sclera: Conjunctivae normal.     Pupils: Pupils are equal, round, and reactive to light.  Neck:     Musculoskeletal: Normal range of motion and neck supple.  Cardiovascular:     Rate and  Rhythm: Normal rate and regular rhythm.     Heart sounds: Normal heart sounds.  Pulmonary:     Effort: Pulmonary effort is normal.     Breath sounds: Normal breath sounds.  Abdominal:     General: Bowel sounds are normal.     Palpations: Abdomen is soft.  Skin:    General: Skin is warm and dry.     Capillary Refill: Capillary refill takes less than 2 seconds.  Neurological:     Mental Status: He is alert and oriented to person, place, and time.  Psychiatric:        Behavior: Behavior normal.      Musculoskeletal Exam: C-spine thoracic and lumbar spine good range of motion.  He has some discomfort range of motion of his lumbar spine.  He has discomfort with range of motion of his right shoulder joint.  Left shoulder joint elbow joints wrist joint MCPs PIPs DIPs with good range of motion with no synovitis.  Hip joints knee joints ankles MTPs PIPs been good range of motion with no synovitis.  CDAI Exam: CDAI Score: Not documented Patient Global Assessment: Not documented; Provider Global Assessment: Not documented Swollen: Not documented; Tender: Not documented Joint Exam   Not documented   There is currently no information documented on the homunculus. Go to the Rheumatology activity and complete the homunculus joint exam.  Investigation: No additional findings.  Imaging: Xr Knee 3 View Left  Result Date: 07/19/2018 Mild medial compartment narrowing was noted.  Mild patellofemoral narrowing was noted.  No chondrocalcinosis was noted. Impression: These findings are consistent with mild osteoarthritis and mild chondromalacia patella.  Xr Knee 3 View Right  Result Date: 07/19/2018 Mild medial compartment narrowing was noted.  No patellofemoral narrowing was noted.  No chondrocalcinosis was noted. Impression: These findings are consistent with mild osteoarthritis of the knee joint.  Xr Lumbar Spine 2-3 Views  Result Date: 07/19/2018 No significant disc space narrowing was noted.   No SI joint narrowing or sclerosis was noted.  Facet joint arthropathy was noted. Impression: These findings are consistent with facet joint arthropathy.   Recent Labs: Lab Results  Component Value Date   WBC 13.2 (H) 08/14/2018   HGB 15.4 08/14/2018   PLT 244 08/14/2018   NA 139 08/14/2018   K 3.8 08/14/2018   CL 101 08/14/2018   CO2 32 08/14/2018   GLUCOSE 91 08/14/2018   BUN 12 08/14/2018   CREATININE 0.72 08/14/2018   BILITOT 0.4 08/14/2018   ALKPHOS 47 07/07/2018   AST 12 08/14/2018   ALT 24 08/14/2018   PROT 6.6 08/14/2018   ALBUMIN 4.0 07/07/2018   CALCIUM 9.4 08/14/2018   GFRAA 148 08/14/2018   QFTBGOLDPLUS NEGATIVE 07/11/2018    Speciality Comments: No specialty comments available.  Procedures:  No procedures performed Allergies: Amoxicillin and Penicillins   Assessment / Plan:     Visit Diagnoses: Behcet's disease (HCC)-patient is clinically doing well on current combination of medication.  He has had no oral ulcers or rash since the last visit.  He denies any joint swelling.  My plan  is to increase Imuran 250 mg p.o. daily.  We will continue prednisone taper by 5 mg every 2 weeks until he reaches 40 mg.  Then taper by 5 mg every week until he reaches 20 mg.  Then 2.5 mg every week until he reaches 10 mg.  After that it will be 1 mg every 2 weeks taper until he reaches 5 mg.  High risk medication use - Imuran  po qd, prednisone 50 mg po qd, colchicine 0.6 mg po qd.  His labs are stable.  He has been getting blood glucose levels with his dad's glucometer which is been stable.  Rule out uveitis-he has had a inflammation in the beginning although he is not having any eye symptoms now.  Has appointment coming up with Dr. Sherryll Burger.  Muscle cramps-patient states that the muscle cramps have improved.  Long term (current) use of systemic steroids - Prednisone 50 mg po daily.  Dietary modifications were discussed.  Maculopapular rash, generalized-has completely  resolved.  He had some fine acne related to prednisone.  Primary osteoarthritis of both knees-he has mild chronic pain.  Vitamin D deficiency-he is on vitamin D supplement.  Lumbar facet arthropathy -chronic discomfort.  Orders: No orders of the defined types were placed in this encounter.  Meds ordered this encounter  Medications  . azaTHIOprine (IMURAN) 50 MG tablet    Sig: Take 3 tablets (150 mg total) by mouth daily.    Dispense:  270 tablet    Refill:  0      Follow-Up Instructions: Return in about 3 months (around 11/15/2018) for Behcet's , OA.   Pollyann Savoy, MD  Note - This record has been created using Animal nutritionist.  Chart creation errors have been sought, but may not always  have been located. Such creation errors do not reflect on  the standard of medical care.

## 2018-08-15 NOTE — Patient Instructions (Signed)
Decrease prednisone by 5 mg every 2 weeks until you reach 40 mg p.o. daily Decrease prednisone by 5 mg every week until you reach 20 mg Decrease prednisone 2.5 mg p.o. weekly until you reach prednisone 10 mg p.o. daily Decrease prednisone 1 mg every 2 weeks until you reach prednisone 5 mg p.o. daily  Increase Imuran 250 mg p.o. daily  Repeat labs in 2 weeks and then every 2 months.

## 2018-08-15 NOTE — Progress Notes (Signed)
WNLs.  WBC is high because of prednisone.

## 2018-08-17 ENCOUNTER — Ambulatory Visit: Payer: BLUE CROSS/BLUE SHIELD | Admitting: Rheumatology

## 2018-09-13 ENCOUNTER — Other Ambulatory Visit: Payer: Self-pay | Admitting: *Deleted

## 2018-09-13 DIAGNOSIS — Z79899 Other long term (current) drug therapy: Secondary | ICD-10-CM

## 2018-09-16 LAB — COMPLETE METABOLIC PANEL WITH GFR
AG Ratio: 1.9 (calc) (ref 1.0–2.5)
ALT: 30 U/L (ref 9–46)
AST: 18 U/L (ref 10–40)
Albumin: 4.4 g/dL (ref 3.6–5.1)
Alkaline phosphatase (APISO): 42 U/L (ref 36–130)
BUN: 15 mg/dL (ref 7–25)
CO2: 29 mmol/L (ref 20–32)
Calcium: 9.8 mg/dL (ref 8.6–10.3)
Chloride: 100 mmol/L (ref 98–110)
Creat: 1.07 mg/dL (ref 0.60–1.35)
GFR, Est African American: 110 mL/min/{1.73_m2} (ref >=60)
GFR, Est Non African American: 95 mL/min/{1.73_m2} (ref >=60)
Globulin: 2.3 g/dL (calc) (ref 1.9–3.7)
Glucose, Bld: 96 mg/dL (ref 65–99)
Potassium: 4.1 mmol/L (ref 3.5–5.3)
Sodium: 139 mmol/L (ref 135–146)
Total Bilirubin: 0.4 mg/dL (ref 0.2–1.2)
Total Protein: 6.7 g/dL (ref 6.1–8.1)

## 2018-09-16 LAB — CBC WITH DIFFERENTIAL/PLATELET
Absolute Monocytes: 656 cells/uL (ref 200–950)
Basophils Absolute: 49 cells/uL (ref 0–200)
Basophils Relative: 0.6 %
Eosinophils Absolute: 107 cells/uL (ref 15–500)
Eosinophils Relative: 1.3 %
HCT: 45.6 % (ref 38.5–50.0)
Hemoglobin: 15.8 g/dL (ref 13.2–17.1)
Lymphs Abs: 2321 cells/uL (ref 850–3900)
MCH: 30.6 pg (ref 27.0–33.0)
MCHC: 34.6 g/dL (ref 32.0–36.0)
MCV: 88.4 fL (ref 80.0–100.0)
MPV: 9.7 fL (ref 7.5–12.5)
Monocytes Relative: 8 %
Neutro Abs: 5068 cells/uL (ref 1500–7800)
Neutrophils Relative %: 61.8 %
Platelets: 258 10*3/uL (ref 140–400)
RBC: 5.16 10*6/uL (ref 4.20–5.80)
RDW: 14 % (ref 11.0–15.0)
Total Lymphocyte: 28.3 %
WBC: 8.2 10*3/uL (ref 3.8–10.8)

## 2018-10-04 ENCOUNTER — Telehealth: Payer: Self-pay | Admitting: *Deleted

## 2018-10-04 MED ORDER — PREDNISONE 5 MG PO TABS: ORAL_TABLET | ORAL | 0 refills | Status: DC

## 2018-10-04 MED FILL — predniSONE 5 MG TABS: 5 | 30 days supply | Qty: 120 | Fill #0

## 2018-10-04 NOTE — Telephone Encounter (Signed)
Patient called requesting refill of prednisone.   Last visit: 08/15/2018 Next visit: 11/29/2018  Okay to refill per Dr. Corliss Skains.

## 2018-10-16 ENCOUNTER — Other Ambulatory Visit: Payer: Self-pay | Admitting: Physician Assistant

## 2018-10-20 MED FILL — MITIGARE 0.6 MG CAPSULE: 0.6 | 30 days supply | Qty: 60 | Fill #0

## 2018-10-26 ENCOUNTER — Other Ambulatory Visit: Payer: Self-pay | Admitting: *Deleted

## 2018-10-26 ENCOUNTER — Telehealth: Payer: Self-pay | Admitting: *Deleted

## 2018-10-26 MED ORDER — DICLOFENAC SODIUM 1 % TD GEL: TRANSDERMAL | 2 refills | Status: DC

## 2018-10-26 MED FILL — DICLOFENAC SODIUM 1 % GEL: 1 | 25 days supply | Qty: 400 | Fill #0

## 2018-10-26 NOTE — Telephone Encounter (Signed)
Prescription has been sent to the pharmacy. 

## 2018-10-26 NOTE — Telephone Encounter (Signed)
Patient requesting a prescription for Voltaren Gel . Patient is having pain with his knees. Please advise.

## 2018-10-26 NOTE — Telephone Encounter (Signed)
Okay to give the prescription.  Please tell him that it is available over-the-counter as well.

## 2018-10-26 NOTE — Telephone Encounter (Signed)
Prior authorization on Voltaren Gel submitted and has been approved.

## 2018-11-15 ENCOUNTER — Other Ambulatory Visit: Payer: Self-pay | Admitting: Pharmacist

## 2018-11-15 DIAGNOSIS — M352 Behcet's disease: Secondary | ICD-10-CM

## 2018-11-15 MED ORDER — PREDNISONE 1 MG PO TABS: ORAL_TABLET | ORAL | 0 refills | Status: AC

## 2018-11-15 MED ORDER — PREDNISONE 5 MG PO TABS: 5.0000 mg | ORAL_TABLET | Freq: Every day | ORAL | 0 refills | Status: DC

## 2018-11-15 MED FILL — predniSONE 1 MG TABS: 1 | 28 days supply | Qty: 98 | Fill #0

## 2018-11-15 MED FILL — predniSONE 5 MG TABS: 5 | 30 days supply | Qty: 30 | Fill #0

## 2018-11-17 NOTE — Progress Notes (Deleted)
Office Visit Note  Patient: Cristian Weiss             Date of Birth: Oct 26, 1990           MRN: [id number]             PCP: Karlene Einstein, MD Referring: Karlene Einstein, MD Visit Date: 11/29/2018 Occupation: @GUAROCC @  Subjective:  No chief complaint on file.   Imuran 50 mg 3 tablets daily and prednisone 9 mg daily.  Most recent CBC/CMP within normal limits on 09/15/2018.  Due for CBC/CMP in July and will monitor every 3 months.  Standing orders are in place.  He received a flu vaccine in October.  Recommend Prevnar 13 and Pneumovax 23 as indicated for immunosuppressant therapy.   History of Present Illness: Cristian Weiss is a 28 y.o. male ***   Activities of Daily Living:  Patient reports morning stiffness for *** {minute/hour:19697}.   Patient {ACTIONS;DENIES/REPORTS:21021675::"Denies"} nocturnal pain.  Difficulty dressing/grooming: {ACTIONS;DENIES/REPORTS:21021675::"Denies"} Difficulty climbing stairs: {ACTIONS;DENIES/REPORTS:21021675::"Denies"} Difficulty getting out of chair: {ACTIONS;DENIES/REPORTS:21021675::"Denies"} Difficulty using hands for taps, buttons, cutlery, and/or writing: {ACTIONS;DENIES/REPORTS:21021675::"Denies"}  No Rheumatology ROS completed.   PMFS History:  Patient Active Problem List   Diagnosis Date Noted  . Behcet's disease ([place]) 08/15/2018  . Primary osteoarthritis of both knees 08/15/2018  . Lumbar facet arthropathy 08/15/2018  . Vitamin D deficiency 07/17/2018  . Long term (current) use of systemic steroids 07/17/2018  . Chronic right shoulder pain 07/17/2018  . Chronic midline low back pain without sciatica 07/17/2018  . Oral ulceration 07/08/2018  . Pharyngitis   . Fever 07/07/2018  . Maculopapular rash, generalized 07/07/2018  . SIRS (systemic inflammatory response syndrome) ([place]) 07/07/2018    Past Medical History:  Diagnosis Date  . ADHD (attention deficit hyperactivity disorder)   . Behcet's disease ([place])   . Dyslexia      Family History  Problem Relation Age of Onset  . Diabetes Mellitus II Father    Past Surgical History:  Procedure Laterality Date  . HAND SURGERY Right    Social History   Social History Narrative  . Not on file   Immunization History  Administered Date(s) Administered  . Td 12/28/2015     Objective: Vital Signs: There were no vitals taken for this visit.   Physical Exam   Musculoskeletal Exam: ***  CDAI Exam: CDAI Score: - Patient Global: -; Provider Global: - Swollen: -; Tender: - Joint Exam   No joint exam has been documented for this visit   There is currently no information documented on the homunculus. Go to the Rheumatology activity and complete the homunculus joint exam.  Investigation: No additional findings.  Imaging: No results found.  Recent Labs: Lab Results  Component Value Date   WBC 8.2 09/15/2018   HGB 15.8 09/15/2018   PLT 258 09/15/2018   NA 139 09/15/2018   K 4.1 09/15/2018   CL 100 09/15/2018   CO2 29 09/15/2018   GLUCOSE 96 09/15/2018   BUN 15 09/15/2018   CREATININE 1.07 09/15/2018   BILITOT 0.4 09/15/2018   ALKPHOS 47 07/07/2018   AST 18 09/15/2018   ALT 30 09/15/2018   PROT 6.7 09/15/2018   ALBUMIN 4.0 07/07/2018   CALCIUM 9.8 09/15/2018   GFRAA 110 09/15/2018   QFTBGOLDPLUS NEGATIVE 07/11/2018    Speciality Comments: No specialty comments available.  Procedures:  No procedures performed Allergies: Amoxicillin and Penicillins   Assessment / Plan:     Visit Diagnoses: Behcet's disease ([place])  High  risk medication use  Long term (current) use of systemic steroids  Muscle cramps  Primary osteoarthritis of both knees  Lumbar facet arthropathy  Chronic right shoulder pain  Chronic midline low back pain without sciatica  Chronic pain of both knees  Vitamin D deficiency  Photophobia   Orders: No orders of the defined types were placed in this encounter.  No orders of the defined types were placed in  this encounter.   Face-to-face time spent with patient was *** minutes. Greater than 50% of time was spent in counseling and coordination of care.  Follow-Up Instructions: No follow-ups on file.   Pollyann SavoyShaili Ida Uppal, MD  Note - This record has been created using Animal nutritionistDragon software.  Chart creation errors have been sought, but may not always  have been located. Such creation errors do not reflect on  the standard of medical care.

## 2018-11-28 ENCOUNTER — Other Ambulatory Visit: Payer: Self-pay

## 2018-11-28 ENCOUNTER — Ambulatory Visit (INDEPENDENT_AMBULATORY_CARE_PROVIDER_SITE_OTHER): Payer: BC Managed Care – PPO | Admitting: Rheumatology

## 2018-11-28 ENCOUNTER — Encounter: Payer: Self-pay | Admitting: Rheumatology

## 2018-11-28 VITALS — BP 135/74 | HR 74 | Resp 14 | Ht 75.0 in | Wt 194.6 lb

## 2018-11-28 DIAGNOSIS — E559 Vitamin D deficiency, unspecified: Secondary | ICD-10-CM

## 2018-11-28 DIAGNOSIS — M352 Behcet's disease: Secondary | ICD-10-CM | POA: Diagnosis not present

## 2018-11-28 DIAGNOSIS — Z79899 Other long term (current) drug therapy: Secondary | ICD-10-CM | POA: Diagnosis not present

## 2018-11-28 DIAGNOSIS — Z7952 Long term (current) use of systemic steroids: Secondary | ICD-10-CM

## 2018-11-28 DIAGNOSIS — M170 Bilateral primary osteoarthritis of knee: Secondary | ICD-10-CM

## 2018-11-28 DIAGNOSIS — M25511 Pain in right shoulder: Secondary | ICD-10-CM

## 2018-11-28 DIAGNOSIS — M79641 Pain in right hand: Secondary | ICD-10-CM

## 2018-11-28 DIAGNOSIS — M47816 Spondylosis without myelopathy or radiculopathy, lumbar region: Secondary | ICD-10-CM

## 2018-11-28 DIAGNOSIS — G8929 Other chronic pain: Secondary | ICD-10-CM

## 2018-11-28 NOTE — Progress Notes (Signed)
Office Visit Note  Patient: Cristian Weiss             Date of Birth: 1990-12-10           MRN: [id number]             PCP: Jolene ProvostHaimes, David M, MD Referring: Jolene ProvostHaimes, David M, MD Visit Date: 11/28/2018 Occupation: @GUAROCC @  Subjective:  Follow-up (Doing good)   History of Present Illness: Cristian Weiss is a 28 y.o. male with history of Behcet's and osteoarthritis.  He is currently on Imuran 150 mg p.o. daily.  He has tapered prednisone down to 1 mg p.o. daily.  He is tapering prednisone by 1 mg every 2 weeks.  He takes colchicine 0.6 mg p.o. daily.  He states he has occasional discomfort in his knee joints and lower back which she relates to his work.  Third MCP joint.  He is uncertain if it is related to inflammation or an injury bumped his hand into an object few days ago.  He denies any history of eye inflammation.  He was seen by Dr. Sherryll BurgerShah today and the examination was normal per report.  Activities of Daily Living:  Patient reports morning stiffness for 0 minute.   Patient Denies nocturnal pain.  Difficulty dressing/grooming: Denies Difficulty climbing stairs: Denies Difficulty getting out of chair: Denies Difficulty using hands for taps, buttons, cutlery, and/or writing: Denies  Review of Systems  Constitutional: Negative for fatigue and night sweats.  HENT: Negative for mouth sores, mouth dryness and nose dryness.   Eyes: Negative for redness and dryness.  Respiratory: Negative for shortness of breath and difficulty breathing.   Cardiovascular: Negative for chest pain, palpitations, hypertension, irregular heartbeat and swelling in legs/feet.  Gastrointestinal: Negative for constipation and diarrhea.  Endocrine: Negative for increased urination.  Musculoskeletal: Positive for joint swelling. Negative for arthralgias, joint pain, myalgias, muscle weakness, morning stiffness, muscle tenderness and myalgias.  Skin: Negative for color change, rash, hair loss, nodules/bumps, skin  tightness, ulcers and sensitivity to sunlight.  Allergic/Immunologic: Negative for susceptible to infections.  Neurological: Negative for dizziness, fainting, memory loss, night sweats and weakness ( ).  Hematological: Negative for swollen glands.  Psychiatric/Behavioral: Negative for depressed mood and sleep disturbance. The patient is not nervous/anxious.     PMFS History:  Patient Active Problem List   Diagnosis Date Noted  . 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 Primary osteoarthritis of both knees 08/15/2018  . Lumbar facet arthropathy 08/15/2018  . Vitamin D deficiency 07/17/2018  . Long term (current) use of systemic steroids 07/17/2018  . Chronic right shoulder pain 07/17/2018  . Chronic midline low back pain without sciatica 07/17/2018  . Oral ulceration 07/08/2018  . Pharyngitis   . Fever 07/07/2018  . Maculopapular rash, generalized 07/07/2018  . SIRS (systemic inflammatory response syndrome) (HCC) 07/07/2018    Past Medical History:  Diagnosis Date  . ADHD (attention deficit hyperactivity disorder)   . Behcet's disease (HCC)   . Dyslexia     Family History  Problem Relation Age of Onset  . Diabetes Mellitus II Father    Past Surgical History:  Procedure Laterality Date  . HAND SURGERY Right    Social History   Social History Narrative  . Not on file   Immunization History  Administered Date(s) Administered  . Hepatitis B 02/24/2011, 03/29/2011  . Influenza-Unspecified 03/04/2018  . Td 12/28/2015     Objective: Vital Signs: BP 135/74 (BP Location: Left Arm, Patient Position: Sitting, Cuff Size: Normal)  Pulse 74   Resp 14   Ht 6\' 3"  (1.905 m)   Wt 194 lb 9.6 oz (88.3 kg)   BMI 24.32 kg/m    Physical Exam Vitals signs and nursing note reviewed.  Constitutional:      Appearance: He is well-developed.  HENT:     Head: Normocephalic and atraumatic.  Eyes:     Conjunctiva/sclera: Conjunctivae normal.     Pupils: Pupils are equal, round, and  reactive to light.  Neck:     Musculoskeletal: Normal range of motion and neck supple.  Cardiovascular:     Rate and Rhythm: Normal rate and regular rhythm.     Heart sounds: Normal heart sounds.  Pulmonary:     Effort: Pulmonary effort is normal.     Breath sounds: Normal breath sounds.  Abdominal:     General: Bowel sounds are normal.     Palpations: Abdomen is soft.  Skin:    General: Skin is warm and dry.     Capillary Refill: Capillary refill takes less than 2 seconds.  Neurological:     Mental Status: He is alert and oriented to person, place, and time.  Psychiatric:        Behavior: Behavior normal.      Musculoskeletal Exam: C-spine, thoracic and lumbar spine were in good range of motion.  Shoulder joints, elbow joints, wrist joints with good range of motion.  He has some tenderness and swelling over his right third MCP joint.  Hip joints, knee joints, ankles MTPs PIPs with good range of motion with no synovitis.  CDAI Exam: CDAI Score: - Patient Global: -; Provider Global: - Swollen: -; Tender: - Joint Exam   No joint exam has been documented for this visit   There is currently no information documented on the homunculus. Go to the Rheumatology activity and complete the homunculus joint exam.  Investigation: No additional findings.  Imaging: No results found.  Recent Labs: Lab Results  Component Value Date   WBC 8.2 09/15/2018   HGB 15.8 09/15/2018   PLT 258 09/15/2018   NA 139 09/15/2018   K 4.1 09/15/2018   CL 100 09/15/2018   CO2 29 09/15/2018   GLUCOSE 96 09/15/2018   BUN 15 09/15/2018   CREATININE 1.07 09/15/2018   BILITOT 0.4 09/15/2018   ALKPHOS 47 07/07/2018   AST 18 09/15/2018   ALT 30 09/15/2018   PROT 6.7 09/15/2018   ALBUMIN 4.0 07/07/2018   CALCIUM 9.8 09/15/2018   GFRAA 110 09/15/2018   QFTBGOLDPLUS NEGATIVE 07/11/2018    Speciality Comments: No specialty comments available.  Procedures:  No procedures performed Allergies:  Amoxicillin and Penicillins   Assessment / Plan:     Visit Diagnoses: Behcet's disease ([place]) -he is clinically doing well on current combination of medication.  He denies any history of ulcers or rash.  He noticed some swelling on his right third MCP today.  He is uncertain if it is due to injury or inflammation.  Plan: Sedimentation rate.  Plan is to continue Imuran at 150 mg p.o. daily and continue taper of prednisone as discussed by 1 mg every 2 weeks.  Has been taking colchicine 0.6 mg p.o. daily.  Pain in right hand -he had mild swelling and tenderness over right third MCP joint.  Have advised him to monitor his closely and contact us in case he has increased swelling.  High risk medication use - Plan: COMPLETE METABOLIC PANEL WITH GFR, CBC with Differential/Platelet, today and then  every 3 months.  Long term (current) use of systemic steroids -he will continue the taper as planned.  Vitamin D deficiency -he is on vitamin D.  Plan: VITAMIN D 25 Hydroxy (Vit-D Deficiency, Fractures),   Primary osteoarthritis of both knees -he has some discomfort in his knee joints.  Lumbar facet arthropathy -he has chronic discomfort.  Chronic right shoulder pain    Orders: Orders Placed This Encounter  Procedures  . COMPLETE METABOLIC PANEL WITH GFR  . CBC with Differential/Platelet  . VITAMIN D 25 Hydroxy (Vit-D Deficiency, Fractures)  . Sedimentation rate   No orders of the defined types were placed in this encounter.   Follow-Up Instructions: Return in 6 months (on 05/30/2019) for Behcet's.   Pollyann SavoyShaili Rindi Beechy, MD  Note - This record has been created using Animal nutritionistDragon software.  Chart creation errors have been sought, but may not always  have been located. Such creation errors do not reflect on  the standard of medical care.

## 2018-11-29 ENCOUNTER — Ambulatory Visit: Payer: Self-pay | Admitting: Rheumatology

## 2018-11-29 LAB — CBC WITH DIFFERENTIAL/PLATELET
Absolute Monocytes: 713 cells/uL (ref 200–950)
Basophils Absolute: 53 cells/uL (ref 0–200)
Basophils Relative: 0.7 %
Eosinophils Absolute: 53 cells/uL (ref 15–500)
Eosinophils Relative: 0.7 %
HCT: 45.6 % (ref 38.5–50.0)
Hemoglobin: 16.1 g/dL (ref 13.2–17.1)
Lymphs Abs: 1410 cells/uL (ref 850–3900)
MCH: 32.1 pg (ref 27.0–33.0)
MCHC: 35.3 g/dL (ref 32.0–36.0)
MCV: 90.8 fL (ref 80.0–100.0)
MPV: 10 fL (ref 7.5–12.5)
Monocytes Relative: 9.5 %
Neutro Abs: 5273 cells/uL (ref 1500–7800)
Neutrophils Relative %: 70.3 %
Platelets: 277 10*3/uL (ref 140–400)
RBC: 5.02 10*6/uL (ref 4.20–5.80)
RDW: 13.2 % (ref 11.0–15.0)
Total Lymphocyte: 18.8 %
WBC: 7.5 10*3/uL (ref 3.8–10.8)

## 2018-11-29 LAB — COMPLETE METABOLIC PANEL WITH GFR
AG Ratio: 1.8 (calc) (ref 1.0–2.5)
ALT: 26 U/L (ref 9–46)
AST: 20 U/L (ref 10–40)
Albumin: 5 g/dL (ref 3.6–5.1)
Alkaline phosphatase (APISO): 40 U/L (ref 36–130)
BUN: 12 mg/dL (ref 7–25)
CO2: 27 mmol/L (ref 20–32)
Calcium: 9.8 mg/dL (ref 8.6–10.3)
Chloride: 102 mmol/L (ref 98–110)
Creat: 0.99 mg/dL (ref 0.60–1.35)
GFR, Est African American: 120 mL/min/{1.73_m2} (ref >=60)
GFR, Est Non African American: 103 mL/min/{1.73_m2} (ref >=60)
Globulin: 2.8 g/dL (calc) (ref 1.9–3.7)
Glucose, Bld: 87 mg/dL (ref 65–99)
Potassium: 4.2 mmol/L (ref 3.5–5.3)
Sodium: 139 mmol/L (ref 135–146)
Total Bilirubin: 0.7 mg/dL (ref 0.2–1.2)
Total Protein: 7.8 g/dL (ref 6.1–8.1)

## 2018-11-29 LAB — VITAMIN D 25 HYDROXY (VIT D DEFICIENCY, FRACTURES): Vit D, 25-Hydroxy: 77 ng/mL (ref 30–100)

## 2018-11-29 LAB — SEDIMENTATION RATE: Sed Rate: 2 mm/h (ref 0–15)

## 2018-11-29 NOTE — Progress Notes (Signed)
WNLs

## 2018-12-06 ENCOUNTER — Other Ambulatory Visit: Payer: Self-pay | Admitting: Physician Assistant

## 2018-12-06 MED FILL — azaTHIOprine 50 MG TABS: 50 | 90 days supply | Qty: 270 | Fill #0

## 2018-12-06 NOTE — Telephone Encounter (Signed)
Last Visit: 11/28/18 Next Visit: 05/31/19 Labs: 11/28/18 WNL  Okay to refill per Dr. Estanislado Pandy

## 2019-01-02 ENCOUNTER — Other Ambulatory Visit: Payer: Self-pay | Admitting: *Deleted

## 2019-01-02 DIAGNOSIS — M352 Behcet's disease: Secondary | ICD-10-CM

## 2019-01-02 MED ORDER — PREDNISONE 1 MG PO TABS: 2.0000 mg | ORAL_TABLET | Freq: Every day | ORAL | 0 refills | Status: DC

## 2019-01-02 MED ORDER — PREDNISONE 5 MG PO TABS: 5.0000 mg | ORAL_TABLET | Freq: Every day | ORAL | 0 refills | Status: DC

## 2019-01-02 MED FILL — predniSONE 1 MG TABS: 1 | 21 days supply | Qty: 42 | Fill #0

## 2019-01-02 MED FILL — predniSONE 5 MG TABS: 5 | 30 days supply | Qty: 30 | Fill #0

## 2019-01-02 NOTE — Telephone Encounter (Signed)
Prescription refill for prednisone  1 mg and 5 mg.   Last Visit: 11/28/18 Next Visit: 05/31/19  Okay to refill per Dr. Estanislado Pandy

## 2019-01-10 MED FILL — MITIGARE 0.6 MG CAPSULE: 0.6 | 30 days supply | Qty: 60 | Fill #0

## 2019-01-23 ENCOUNTER — Other Ambulatory Visit: Payer: Self-pay

## 2019-01-23 DIAGNOSIS — Z20822 Contact with and (suspected) exposure to covid-19: Secondary | ICD-10-CM

## 2019-01-24 LAB — NOVEL CORONAVIRUS, NAA: SARS-CoV-2, NAA: NOT DETECTED

## 2019-02-12 MED FILL — DICLOFENAC SODIUM 1 % GEL: 1 | 25 days supply | Qty: 400 | Fill #1

## 2019-02-15 ENCOUNTER — Other Ambulatory Visit: Payer: Self-pay | Admitting: *Deleted

## 2019-02-15 MED ORDER — PREDNISONE 1 MG PO TABS: ORAL_TABLET | ORAL | 0 refills | Status: DC

## 2019-02-15 MED FILL — predniSONE 1 MG TABS: 1 | 30 days supply | Qty: 140 | Fill #0

## 2019-02-15 NOTE — Telephone Encounter (Signed)
Last Visit: 11/28/18 Next Visit: 05/31/19  Okay to refill per Dr. Estanislado Pandy

## 2019-03-01 MED FILL — FLUoxetine HCL 20 MG CAPS: 20 | 30 days supply | Qty: 30 | Fill #0

## 2019-03-13 ENCOUNTER — Other Ambulatory Visit: Payer: Self-pay | Admitting: *Deleted

## 2019-03-13 DIAGNOSIS — Z79899 Other long term (current) drug therapy: Secondary | ICD-10-CM

## 2019-03-13 LAB — CBC WITH DIFFERENTIAL/PLATELET
Absolute Monocytes: 717 cells/uL (ref 200–950)
Basophils Absolute: 50 cells/uL (ref 0–200)
Basophils Relative: 0.7 %
Eosinophils Absolute: 128 cells/uL (ref 15–500)
Eosinophils Relative: 1.8 %
HCT: 46.9 % (ref 38.5–50.0)
Hemoglobin: 16.4 g/dL (ref 13.2–17.1)
Lymphs Abs: 1803 cells/uL (ref 850–3900)
MCH: 31.8 pg (ref 27.0–33.0)
MCHC: 35 g/dL (ref 32.0–36.0)
MCV: 90.9 fL (ref 80.0–100.0)
MPV: 10 fL (ref 7.5–12.5)
Monocytes Relative: 10.1 %
Neutro Abs: 4402 cells/uL (ref 1500–7800)
Neutrophils Relative %: 62 %
Platelets: 288 10*3/uL (ref 140–400)
RBC: 5.16 10*6/uL (ref 4.20–5.80)
RDW: 12.5 % (ref 11.0–15.0)
Total Lymphocyte: 25.4 %
WBC: 7.1 10*3/uL (ref 3.8–10.8)

## 2019-03-13 LAB — COMPLETE METABOLIC PANEL WITH GFR
AG Ratio: 1.7 (calc) (ref 1.0–2.5)
ALT: 25 U/L (ref 9–46)
AST: 18 U/L (ref 10–40)
Albumin: 4.8 g/dL (ref 3.6–5.1)
Alkaline phosphatase (APISO): 48 U/L (ref 36–130)
BUN: 8 mg/dL (ref 7–25)
CO2: 30 mmol/L (ref 20–32)
Calcium: 9.9 mg/dL (ref 8.6–10.3)
Chloride: 102 mmol/L (ref 98–110)
Creat: 0.92 mg/dL (ref 0.60–1.35)
GFR, Est African American: 131 mL/min/{1.73_m2} (ref >=60)
GFR, Est Non African American: 113 mL/min/{1.73_m2} (ref >=60)
Globulin: 2.8 g/dL (calc) (ref 1.9–3.7)
Glucose, Bld: 88 mg/dL (ref 65–139)
Potassium: 4.1 mmol/L (ref 3.5–5.3)
Sodium: 137 mmol/L (ref 135–146)
Total Bilirubin: 0.7 mg/dL (ref 0.2–1.2)
Total Protein: 7.6 g/dL (ref 6.1–8.1)

## 2019-03-19 ENCOUNTER — Other Ambulatory Visit: Payer: Self-pay | Admitting: Rheumatology

## 2019-03-19 MED FILL — azaTHIOprine 50 MG TABS: 50 | 90 days supply | Qty: 270 | Fill #0

## 2019-03-19 MED FILL — MITIGARE 0.6 MG CAPSULE: 0.6 | 30 days supply | Qty: 60 | Fill #1

## 2019-03-19 NOTE — Telephone Encounter (Signed)
Last Visit: 11/28/18 Next Visit: 05/31/19 Labs: 03/13/19 WNL  Okay to refill per Dr. Estanislado Pandy

## 2019-04-03 MED FILL — FLUoxetine HCL 20 MG CAPS: 20 | 30 days supply | Qty: 30 | Fill #1

## 2019-04-17 ENCOUNTER — Encounter: Payer: Self-pay | Admitting: Rheumatology

## 2019-04-17 ENCOUNTER — Ambulatory Visit (INDEPENDENT_AMBULATORY_CARE_PROVIDER_SITE_OTHER): Payer: BC Managed Care – PPO | Admitting: Rheumatology

## 2019-04-17 ENCOUNTER — Other Ambulatory Visit: Payer: Self-pay

## 2019-04-17 VITALS — BP 143/87 | HR 66 | Resp 14 | Ht 75.0 in | Wt 196.0 lb

## 2019-04-17 DIAGNOSIS — Z7952 Long term (current) use of systemic steroids: Secondary | ICD-10-CM

## 2019-04-17 DIAGNOSIS — M47816 Spondylosis without myelopathy or radiculopathy, lumbar region: Secondary | ICD-10-CM

## 2019-04-17 DIAGNOSIS — Z79899 Other long term (current) drug therapy: Secondary | ICD-10-CM

## 2019-04-17 DIAGNOSIS — M352 Behcet's disease: Secondary | ICD-10-CM | POA: Diagnosis not present

## 2019-04-17 DIAGNOSIS — R21 Rash and other nonspecific skin eruption: Secondary | ICD-10-CM

## 2019-04-17 DIAGNOSIS — E559 Vitamin D deficiency, unspecified: Secondary | ICD-10-CM

## 2019-04-17 DIAGNOSIS — M170 Bilateral primary osteoarthritis of knee: Secondary | ICD-10-CM

## 2019-04-17 MED ORDER — METHYLPREDNISOLONE 4 MG PO TBPK: ORAL_TABLET | ORAL | 0 refills | Status: DC

## 2019-04-17 MED FILL — METHYLPREDNISOLONE 4 MG TAB: 4 | 6 days supply | Qty: 21 | Fill #0

## 2019-04-17 NOTE — Progress Notes (Signed)
Office Visit Note  Patient: Cristian Weiss             Date of Birth: 12-03-1990           MRN: [id number]             PCP: Karlene Einstein, MD Referring: Karlene Einstein, MD Visit Date: 04/17/2019 Occupation: _0 @  Subjective:  Rash   History of Present Illness: Cristian Weiss is a 28 y.o. male with history of behcet's and osteoarthritis.  Patient states that 4 days ago he was working in his yard cutting some bushes.  After that he noticed a small patch of redness on his abdominal region.  It was itchy and he tried some over-the-counter medication most likely hydrocortisone cream.  The next day the rash is spread further down his abdomen.  It is still in the same area but has not improved.  He does not have rash anywhere else.  The rash is pruritic.  He has been taking his medications on a regular basis.  He denies any episodes of sores in the mouth or genital ulcers.  None of the joints are painful or swollen.  Activities of Daily Living:  Patient reports morning stiffness for 5-10 minutes.   Patient Denies nocturnal pain.  Difficulty dressing/grooming: Denies Difficulty climbing stairs: Denies Difficulty getting out of chair: Denies Difficulty using hands for taps, buttons, cutlery, and/or writing: Denies  Review of Systems  Constitutional: Negative for fatigue and night sweats.  HENT: Negative for mouth sores, mouth dryness and nose dryness.   Eyes: Negative for redness and dryness.  Respiratory: Negative for cough, hemoptysis, shortness of breath and difficulty breathing.   Cardiovascular: Negative for chest pain, palpitations, hypertension, irregular heartbeat and swelling in legs/feet.  Gastrointestinal: Negative for constipation and diarrhea.  Endocrine: Negative for increased urination.  Genitourinary: Negative for painful urination.  Musculoskeletal: Positive for arthralgias, joint pain and morning stiffness. Negative for joint swelling, myalgias, muscle weakness,  muscle tenderness and myalgias.  Skin: Positive for rash. Negative for color change, hair loss, nodules/bumps, skin tightness, ulcers and sensitivity to sunlight.  Allergic/Immunologic: Negative for susceptible to infections.  Neurological: Negative for dizziness, fainting, memory loss, night sweats and weakness.  Hematological: Negative for swollen glands.  Psychiatric/Behavioral: Negative for depressed mood and sleep disturbance. The patient is not nervous/anxious.     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   Primary osteoarthritis of both knees 08/15/2018   Lumbar facet arthropathy 08/15/2018   Vitamin D deficiency 07/17/2018   Long term (current) use of systemic steroids 07/17/2018   Chronic right shoulder pain 07/17/2018   Chronic midline low back pain without sciatica 07/17/2018   Oral ulceration 07/08/2018   Pharyngitis    Fever 07/07/2018   Maculopapular rash, generalized 07/07/2018   SIRS (systemic inflammatory response syndrome) ([place]) 07/07/2018    Past Medical History:  Diagnosis Date   ADHD (attention deficit hyperactivity disorder)    Behcet's disease ([place])    Dyslexia     Family History  Problem Relation Age of Onset   Diabetes Mellitus II Father    Past Surgical History:  Procedure Laterality Date   HAND SURGERY Right    Social History   Social History Narrative   Not on file   Immunization History  Administered Date(s) Administered   Hepatitis B 02/24/2011, 03/29/2011   Influenza-Unspecified 03/04/2018   Td 12/28/2015     Objective: Vital Signs:  BP (!) 143/87 (BP Location: Left Arm, Patient Position: Sitting, Cuff Size: Normal)    Pulse 66    Resp 14    Ht _0  (1.905 m)    Wt 196 lb (88.9 kg)    BMI 24.50 kg/m    Physical Exam Vitals signs and nursing note reviewed.  Constitutional:      Appearance: He is well-developed.  HENT:     Head: Normocephalic and  atraumatic.  Eyes:     Conjunctiva/sclera: Conjunctivae normal.     Pupils: Pupils are equal, round, and reactive to light.  Neck:     Musculoskeletal: Normal range of motion and neck supple.  Cardiovascular:     Rate and Rhythm: Normal rate and regular rhythm.     Heart sounds: Normal heart sounds.  Pulmonary:     Effort: Pulmonary effort is normal.     Breath sounds: Normal breath sounds.  Abdominal:     General: Bowel sounds are normal.     Palpations: Abdomen is soft.  Skin:    General: Skin is warm and dry.     Capillary Refill: Capillary refill takes less than 2 seconds.     Comments: Erythematous maculopapular rash spreading out from the periumbilical region measurement about 14 cm X11 cm with excoriation marks.  Neurological:     Mental Status: He is alert and oriented to person, place, and time.  Psychiatric:        Behavior: Behavior normal.      Musculoskeletal Exam: C-spine thoracic and lumbar spine good range of motion.  Shoulder joints elbow joints wrist joint MCPs PIPs DIPs with good range of motion.  Hip joints knee joints ankles MTPs PIPs with good range of motion with no synovitis.  CDAI Exam: CDAI Score: -- Patient Global: --; Provider Global: -- Swollen: --; Tender: -- Joint Exam   No joint exam has been documented for this visit   There is currently no information documented on the homunculus. Go to the Rheumatology activity and complete the homunculus joint exam.  Investigation: No additional findings.  Imaging: No results found.  Recent Labs: Lab Results  Component Value Date   WBC 7.1 03/13/2019   HGB 16.4 03/13/2019   PLT 288 03/13/2019   NA 137 03/13/2019   K 4.1 03/13/2019   CL 102 03/13/2019   CO2 30 03/13/2019   GLUCOSE 88 03/13/2019   BUN 8 03/13/2019   CREATININE 0.92 03/13/2019   BILITOT 0.7 03/13/2019   ALKPHOS 47 07/07/2018   AST 18 03/13/2019   ALT 25 03/13/2019   PROT 7.6 03/13/2019   ALBUMIN 4.0 07/07/2018   CALCIUM  9.9 03/13/2019   GFRAA 131 03/13/2019   QFTBGOLDPLUS NEGATIVE 07/11/2018    Speciality Comments: No specialty comments available.  Procedures:  No procedures performed Allergies: Amoxicillin and Penicillins   Assessment / Plan:     Visit Diagnoses: Rash and other nonspecific skin eruption -patient was working in the yard before the rash started.  The rash gradually spread over the next 24 hours and has not spread since then.  He has been having pruritus.  He was using a topical steroid cream which has not been very effective.  He also has large excoriation marks.  These findings are most consistent with poison oak.  I will call in Medrol Dosepak.  Have advised him to contact me in case symptoms get worse.  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  He denies any  oral ulcers or genital ulcers.  There is no history of inflammatory arthritis.  High risk medication use - Imuran 150 mg po daily, colchicine 0.6 mg 1 capsule daily, prednisone 2 mg po daily.  He has been tapering prednisone gradually.- Plan: CBC with diff, CMP with GFR  Long term (current) use of systemic steroids  Vitamin D deficiency  Primary osteoarthritis of both knees  Lumbar facet arthropathy-he has intermittent lower back pain.  Orders: Orders Placed This Encounter  Procedures   CBC with diff   CMP with GFR   ESR   Meds ordered this encounter  Medications   methylPREDNISolone (MEDROL DOSEPAK) 4 MG TBPK tablet    Sig: Take as directed.    Dispense:  21 tablet    Refill:  0     Follow-Up Instructions: Return in about 6 months (around 10/15/2019) for Rash, Behcets syndrome.   Bo Merino, MD  Note - This record has been created using Editor, commissioning.  Chart creation errors have been sought, but may not always  have been located. Such creation errors do not reflect on  the standard of medical care.

## 2019-04-18 LAB — COMPLETE METABOLIC PANEL WITH GFR
AG Ratio: 1.8 (calc) (ref 1.0–2.5)
ALT: 21 U/L (ref 9–46)
AST: 18 U/L (ref 10–40)
Albumin: 4.9 g/dL (ref 3.6–5.1)
Alkaline phosphatase (APISO): 52 U/L (ref 36–130)
BUN: 9 mg/dL (ref 7–25)
CO2: 26 mmol/L (ref 20–32)
Calcium: 9.7 mg/dL (ref 8.6–10.3)
Chloride: 103 mmol/L (ref 98–110)
Creat: 0.89 mg/dL (ref 0.60–1.35)
GFR, Est African American: 135 mL/min/{1.73_m2} (ref >=60)
GFR, Est Non African American: 116 mL/min/{1.73_m2} (ref >=60)
Globulin: 2.7 g/dL (calc) (ref 1.9–3.7)
Glucose, Bld: 91 mg/dL (ref 65–99)
Potassium: 4.2 mmol/L (ref 3.5–5.3)
Sodium: 139 mmol/L (ref 135–146)
Total Bilirubin: 0.6 mg/dL (ref 0.2–1.2)
Total Protein: 7.6 g/dL (ref 6.1–8.1)

## 2019-04-18 LAB — CBC WITH DIFFERENTIAL/PLATELET
Absolute Monocytes: 590 cells/uL (ref 200–950)
Basophils Absolute: 60 cells/uL (ref 0–200)
Basophils Relative: 0.9 %
Eosinophils Absolute: 168 cells/uL (ref 15–500)
Eosinophils Relative: 2.5 %
HCT: 48.1 % (ref 38.5–50.0)
Hemoglobin: 16.6 g/dL (ref 13.2–17.1)
Lymphs Abs: 1420 cells/uL (ref 850–3900)
MCH: 31.4 pg (ref 27.0–33.0)
MCHC: 34.5 g/dL (ref 32.0–36.0)
MCV: 90.9 fL (ref 80.0–100.0)
MPV: 10.2 fL (ref 7.5–12.5)
Monocytes Relative: 8.8 %
Neutro Abs: 4462 cells/uL (ref 1500–7800)
Neutrophils Relative %: 66.6 %
Platelets: 279 10*3/uL (ref 140–400)
RBC: 5.29 10*6/uL (ref 4.20–5.80)
RDW: 12.9 % (ref 11.0–15.0)
Total Lymphocyte: 21.2 %
WBC: 6.7 10*3/uL (ref 3.8–10.8)

## 2019-04-18 LAB — SEDIMENTATION RATE: Sed Rate: 2 mm/h (ref 0–15)

## 2019-04-18 NOTE — Progress Notes (Signed)
All the labs are normal.

## 2019-04-23 MED FILL — MITIGARE 0.6 MG CAPSULE: 0.6 | 30 days supply | Qty: 60 | Fill #2

## 2019-04-23 MED FILL — DICLOFENAC SODIUM 1 % GEL: 1 | 25 days supply | Qty: 400 | Fill #2

## 2019-05-12 MED FILL — FLUoxetine HCL 20 MG CAPS: 20 | 30 days supply | Qty: 30 | Fill #2

## 2019-05-22 MED FILL — FLUoxetine HCL 20 MG CAPS: 20 | 30 days supply | Qty: 30 | Fill #2

## 2019-05-31 ENCOUNTER — Ambulatory Visit: Payer: BC Managed Care – PPO | Admitting: Physician Assistant

## 2019-06-12 MED FILL — BENZONATATE 200 MG CAPS: 200 | 10 days supply | Qty: 30 | Fill #0

## 2019-06-14 ENCOUNTER — Telehealth: Payer: Self-pay | Admitting: *Deleted

## 2019-06-14 MED ORDER — PREDNISONE 10 MG PO TABS: 10.0000 mg | ORAL_TABLET | Freq: Every day | ORAL | 0 refills | Status: DC

## 2019-06-14 MED FILL — FLUoxetine HCL 20 MG CAPS: 20 | 30 days supply | Qty: 30 | Fill #0

## 2019-06-14 MED FILL — predniSONE 10 MG TABS: 10 | 30 days supply | Qty: 30 | Fill #0

## 2019-06-14 NOTE — Telephone Encounter (Addendum)
Patient has tested positive for COVID. Patient is on Imuran and Mitigare. Patient has been holding Imuran since having symptoms of Covid. Patient is concerned about flaring. Please advise

## 2019-06-14 NOTE — Telephone Encounter (Signed)
Patient's wife advised to have patient hold Imuran for 4 weeks.  He can continue taking colchicine as prescribed.  Prescription sent in for prednisone 10 mg 1 tablet by mouth daily.  Dispense 30 tablets. Zero refills. Wife will advise patient.

## 2019-06-14 NOTE — Telephone Encounter (Signed)
Please advise patient to hold Imuran for 4 weeks.  He can continue taking colchicine as prescribed.  Please send in prednisone 10 mg 1 tablet by mouth daily.  Dispense 30 tablets. Zero refills.

## 2019-07-12 ENCOUNTER — Telehealth: Payer: Self-pay | Admitting: Rheumatology

## 2019-07-12 MED ORDER — AZATHIOPRINE 50 MG PO TABS: 150.0000 mg | ORAL_TABLET | Freq: Every day | ORAL | 0 refills | Status: DC

## 2019-07-12 MED ORDER — PREDNISONE 5 MG PO TABS: ORAL_TABLET | ORAL | 0 refills | Status: DC

## 2019-07-12 MED FILL — predniSONE 5 MG TABS: 5 | 21 days supply | Qty: 21 | Fill #0

## 2019-07-12 MED FILL — azaTHIOprine 50 MG TABS: 50 | 90 days supply | Qty: 270 | Fill #0

## 2019-07-12 NOTE — Telephone Encounter (Signed)
Marissa called to check when Cristian Weiss can restart his Imuran and to see how we are going to taper his Prednisone.  Patient is currently taking 10 mg per day.  Marissa states all COVID symptoms have been resolved.

## 2019-07-12 NOTE — Telephone Encounter (Signed)
Cristian Weiss advised Cristian Weiss can restart his Imuran if he is asymptomatic.  He can taper prednisone by 2.5 mg every week. Cristian Weiss states he needs a refill on Prednisone and Imuran.   Last Visit: 04/17/19 Next Visit: due May 2021 Labs: 04/17/19 WNL  Okay to refill per Dr. Corliss Skains

## 2019-07-12 NOTE — Telephone Encounter (Signed)
He can restart his Imuran if he is asymptomatic.  He can taper prednisone by 2.5 mg every week.

## 2019-07-23 ENCOUNTER — Other Ambulatory Visit: Payer: Self-pay

## 2019-07-23 ENCOUNTER — Telehealth (INDEPENDENT_AMBULATORY_CARE_PROVIDER_SITE_OTHER): Payer: BC Managed Care – PPO | Admitting: Physician Assistant

## 2019-07-23 ENCOUNTER — Encounter: Payer: Self-pay | Admitting: Physician Assistant

## 2019-07-23 DIAGNOSIS — Z7952 Long term (current) use of systemic steroids: Secondary | ICD-10-CM

## 2019-07-23 DIAGNOSIS — M170 Bilateral primary osteoarthritis of knee: Secondary | ICD-10-CM

## 2019-07-23 DIAGNOSIS — Z79899 Other long term (current) drug therapy: Secondary | ICD-10-CM | POA: Diagnosis not present

## 2019-07-23 DIAGNOSIS — R252 Cramp and spasm: Secondary | ICD-10-CM

## 2019-07-23 DIAGNOSIS — M352 Behcet's disease: Secondary | ICD-10-CM | POA: Diagnosis not present

## 2019-07-23 DIAGNOSIS — E559 Vitamin D deficiency, unspecified: Secondary | ICD-10-CM

## 2019-07-23 DIAGNOSIS — R21 Rash and other nonspecific skin eruption: Secondary | ICD-10-CM

## 2019-07-23 DIAGNOSIS — M47816 Spondylosis without myelopathy or radiculopathy, lumbar region: Secondary | ICD-10-CM

## 2019-07-23 NOTE — Progress Notes (Signed)
Virtual Visit via Telephone Note  I connected with Cristian Weiss on 07/23/19 at  3:20 PM EST by telephone and verified that I am speaking with the correct person using two identifiers.  Location: Patient: Home  Provider: Clinic  This service was conducted via virtual visit.  The patient was located at home. I was located in my office.  Consent was obtained prior to the virtual visit and is aware of possible charges through their insurance for this visit.  The patient is an established patient.  I  conducted the virtual visit. Office staff helped with scheduling follow up visits after the service was conducted.     I discussed the limitations, risks, security and privacy concerns of performing an evaluation and management service by telephone and the availability of in person appointments. I also discussed with the patient that there may be a patient responsible charge related to this service. The patient expressed understanding and agreed to proceed.  CC: Medication monitoring  History of Present Illness: Patient is a 28 year old male with a past medical history of Behcet's.  He is taking imuran 150 mg po daily, colchicine 0.6 mg 1 capsule daily, and prednisone 7.5 mg daily.  The patient was diagnosed with Covid on 06/14/2019.  He held his dose of Imuran for 4 weeks during that time and was started on prednisone 10 mg by mouth daily for 1 month.  His Covid symptoms have completely resolved and he restarted on Imuran on 07/12/2019.  He has started tapering prednisone by 2.5 mg every week.  He is currently taking prednisone 7.5 mg daily.  He denies any signs or symptoms of a flare while off of Imuran.  He has not had any recent rashes, oral or gential ulcers, or signs of inflammatory arthritis.  He states that his level of fatigue has been stable recently.  He has occasional lower back pain which is exacerbated by physical activity.  He occasionally takes Aleve for pain relief.  He has not needed to use  Voltaren gel recently.  He denies needing any refills at this time   Review of Systems  Constitutional: Positive for malaise/fatigue. Negative for fever.  Eyes: Negative for photophobia, pain, discharge and redness.  Respiratory: Negative for cough, shortness of breath and wheezing.   Cardiovascular: Negative for chest pain and palpitations.  Gastrointestinal: Negative for blood in stool, constipation and diarrhea.  Genitourinary: Negative for dysuria.  Musculoskeletal: Positive for joint pain. Negative for back pain, myalgias and neck pain.       +Morning stiffness   Skin: Negative for rash.  Neurological: Negative for dizziness and headaches.  Psychiatric/Behavioral: Negative for depression. The patient is not nervous/anxious and does not have insomnia.       Observations/Objective: Physical Exam  Constitutional: He is oriented to person, place, and time.  Neurological: He is alert and oriented to person, place, and time.  Psychiatric: Mood, memory, affect and judgment normal.   Patient reports morning stiffness for few minutes.   Patient denies nocturnal pain.  Difficulty dressing/grooming: Denies Difficulty climbing stairs: Denies Difficulty getting out of chair: Denies Difficulty using hands for taps, buttons, cutlery, and/or writing: Denies   Assessment and Plan: Visit Diagnoses: Behcet's disease (HCC)-He has not had any signs or symptoms of a flare recently.  He has not had any recurrence of oral or genital ulcerations.  He has not had any recent rashes.  The maculopapular rash on his abdomen reseolved after the medrol dosepak prescribed in November  2020. He has not had any signs or symptoms of inflammatory arthritis.  ESR was 2 on 04/17/2019.  He is clinically doing well on Imuran 150 mg daily and colchicine 0.6 mg 1 capsule by mouth daily.  He is tolerating both medications without any side effects.  He held Imuran for 1 month after being diagnosed with COVID-19 on  06/14/2019.  While holding Imuran he was started on prednisone 10 mg by mouth daily which she took for 1 month.  His Covid symptoms have completely resolved.  He did not develop any signs or symptoms of a flare while off of Imuran.  He will be tapering prednisone by 2.5 mg every week and is currently on prednisone 7.5 mg daily.  He was advised to notify us if he develops signs or symptoms of a flare while tapering prednisone.  He will follow-up in the office in 5 months.   High risk medication use - Imuran 150 mg po daily, colchicine 0.6 mg 1 capsule daily, prednisone 7.5 mg tapering by 2.5 mg every week.  CBC and CMP were within normal limits on 04/17/2019.  He is due to update lab work.  Standing orders for CBC and CMP are in place.  He is aware that he is to hold Imuran if he develops any signs or symptoms of an infection and to resume once the infection is completely cleared.  Long term (current) use of systemic steroids: He was diagnosed with COVID-19 on 06/14/2019 and was started on prednisone 10 mg by mouth daily for 1 month.  His Covid symptoms have completely resolved and he has started to taper prednisone by 2.5 mg every week.  He is currently on prednisone 7.5 mg daily.  He is tolerating prednisone without any side effects.  Vitamin D deficiency: He takes Replesta 50,000 units once monthly.    Primary osteoarthritis of both knees: He has occasional discomfort in bilateral knee joints.  He has not had any joint swelling.  He has no difficulty climbing steps or getting up from a chair.  He occasionally has increased arthralgias with cooler weather temperatures.  He takes Aleve very sparingly for pain relief.  He has a prescription for Voltaren gel but has not needed to use it recently.  He does not need any refills at this time.  Lumbar facet arthropathy-He has intermittent lower back pain which is exacerbated by physical activity.  He has no symptoms of radiculopathy at this time.  Follow Up  Instructions: He will follow up in 5 months.    I discussed the assessment and treatment plan with the patient. The patient was provided an opportunity to ask questions and all were answered. The patient agreed with the plan and demonstrated an understanding of the instructions.   The patient was advised to call back or seek an in-person evaluation if the symptoms worsen or if the condition fails to improve as anticipated.  I provided 20 minutes of non-face-to-face time during this encounter.   Ofilia Neas, PA-C

## 2019-08-03 ENCOUNTER — Other Ambulatory Visit: Payer: Self-pay | Admitting: *Deleted

## 2019-08-03 DIAGNOSIS — Z79899 Other long term (current) drug therapy: Secondary | ICD-10-CM

## 2019-08-04 LAB — CBC WITH DIFFERENTIAL/PLATELET
Absolute Monocytes: 586 cells/uL (ref 200–950)
Basophils Absolute: 52 cells/uL (ref 0–200)
Basophils Relative: 0.9 %
Eosinophils Absolute: 81 cells/uL (ref 15–500)
Eosinophils Relative: 1.4 %
HCT: 44.9 % (ref 38.5–50.0)
Hemoglobin: 15.8 g/dL (ref 13.2–17.1)
Lymphs Abs: 1850 cells/uL (ref 850–3900)
MCH: 32 pg (ref 27.0–33.0)
MCHC: 35.2 g/dL (ref 32.0–36.0)
MCV: 90.9 fL (ref 80.0–100.0)
MPV: 9.7 fL (ref 7.5–12.5)
Monocytes Relative: 10.1 %
Neutro Abs: 3231 cells/uL (ref 1500–7800)
Neutrophils Relative %: 55.7 %
Platelets: 222 10*3/uL (ref 140–400)
RBC: 4.94 10*6/uL (ref 4.20–5.80)
RDW: 12.2 % (ref 11.0–15.0)
Total Lymphocyte: 31.9 %
WBC: 5.8 10*3/uL (ref 3.8–10.8)

## 2019-08-04 LAB — COMPLETE METABOLIC PANEL WITH GFR
AG Ratio: 1.6 (calc) (ref 1.0–2.5)
ALT: 25 U/L (ref 9–46)
AST: 20 U/L (ref 10–40)
Albumin: 4.5 g/dL (ref 3.6–5.1)
Alkaline phosphatase (APISO): 42 U/L (ref 36–130)
BUN: 13 mg/dL (ref 7–25)
CO2: 27 mmol/L (ref 20–32)
Calcium: 9.8 mg/dL (ref 8.6–10.3)
Chloride: 103 mmol/L (ref 98–110)
Creat: 0.91 mg/dL (ref 0.60–1.35)
GFR, Est African American: 132 mL/min/{1.73_m2} (ref >=60)
GFR, Est Non African American: 114 mL/min/{1.73_m2} (ref >=60)
Globulin: 2.8 g/dL (calc) (ref 1.9–3.7)
Glucose, Bld: 98 mg/dL (ref 65–99)
Potassium: 4.6 mmol/L (ref 3.5–5.3)
Sodium: 138 mmol/L (ref 135–146)
Total Bilirubin: 0.6 mg/dL (ref 0.2–1.2)
Total Protein: 7.3 g/dL (ref 6.1–8.1)

## 2019-08-06 MED FILL — FLUoxetine HCL 20 MG CAPS: 20 | 30 days supply | Qty: 30 | Fill #1

## 2019-08-06 NOTE — Progress Notes (Signed)
CBC and CMP are normal.

## 2019-08-20 ENCOUNTER — Other Ambulatory Visit: Payer: Self-pay | Admitting: Rheumatology

## 2019-08-20 NOTE — Telephone Encounter (Signed)
Last Visit: 07/23/19 Next Visit: due July 2021.  Labs: 08/03/19 WNL  Current Dose per office note on 07/23/19: colchicine 0.6 mg 1 capsule daily  Okay to refill per Dr. Corliss Skains

## 2019-08-30 MED FILL — MITIGARE 0.6 MG CAPS: 0.6 | 90 days supply | Qty: 90 | Fill #0

## 2019-09-03 MED FILL — FLUoxetine HCL 20 MG CAPS: 20 | 30 days supply | Qty: 30 | Fill #2

## 2019-11-12 MED FILL — FLUoxetine HCL 20 MG CAPS: 20 | 30 days supply | Qty: 30 | Fill #0

## 2019-11-14 ENCOUNTER — Other Ambulatory Visit: Payer: Self-pay | Admitting: *Deleted

## 2019-11-14 DIAGNOSIS — Z79899 Other long term (current) drug therapy: Secondary | ICD-10-CM

## 2019-11-15 LAB — COMPLETE METABOLIC PANEL WITH GFR
AG Ratio: 1.6 (calc) (ref 1.0–2.5)
ALT: 20 U/L (ref 9–46)
AST: 17 U/L (ref 10–40)
Albumin: 4.3 g/dL (ref 3.6–5.1)
Alkaline phosphatase (APISO): 55 U/L (ref 36–130)
BUN: 12 mg/dL (ref 7–25)
CO2: 26 mmol/L (ref 20–32)
Calcium: 9.2 mg/dL (ref 8.6–10.3)
Chloride: 104 mmol/L (ref 98–110)
Creat: 0.95 mg/dL (ref 0.60–1.35)
GFR, Est African American: 125 mL/min/{1.73_m2} (ref >=60)
GFR, Est Non African American: 108 mL/min/{1.73_m2} (ref >=60)
Globulin: 2.7 g/dL (calc) (ref 1.9–3.7)
Glucose, Bld: 115 mg/dL (ref 65–139)
Potassium: 4.1 mmol/L (ref 3.5–5.3)
Sodium: 140 mmol/L (ref 135–146)
Total Bilirubin: 0.5 mg/dL (ref 0.2–1.2)
Total Protein: 7 g/dL (ref 6.1–8.1)

## 2019-11-15 LAB — CBC WITH DIFFERENTIAL/PLATELET
Absolute Monocytes: 765 cells/uL (ref 200–950)
Basophils Absolute: 51 cells/uL (ref 0–200)
Basophils Relative: 0.5 %
Eosinophils Absolute: 143 cells/uL (ref 15–500)
Eosinophils Relative: 1.4 %
HCT: 43.7 % (ref 38.5–50.0)
Hemoglobin: 15.2 g/dL (ref 13.2–17.1)
Lymphs Abs: 1622 cells/uL (ref 850–3900)
MCH: 32.4 pg (ref 27.0–33.0)
MCHC: 34.8 g/dL (ref 32.0–36.0)
MCV: 93.2 fL (ref 80.0–100.0)
MPV: 10.6 fL (ref 7.5–12.5)
Monocytes Relative: 7.5 %
Neutro Abs: 7619 cells/uL (ref 1500–7800)
Neutrophils Relative %: 74.7 %
Platelets: 231 10*3/uL (ref 140–400)
RBC: 4.69 10*6/uL (ref 4.20–5.80)
RDW: 12.6 % (ref 11.0–15.0)
Total Lymphocyte: 15.9 %
WBC: 10.2 10*3/uL (ref 3.8–10.8)

## 2019-11-15 NOTE — Progress Notes (Signed)
CBC and CMP are normal.

## 2019-11-21 MED FILL — HYDROCODONE-HOMATROPINE SOL: 5-1.5 | 6 days supply | Qty: 120 | Fill #0

## 2019-11-21 MED FILL — DOXYCYCLINE HYCLATE 100 MG: 100 | 10 days supply | Qty: 20 | Fill #0

## 2019-12-06 ENCOUNTER — Telehealth: Payer: Self-pay | Admitting: *Deleted

## 2019-12-06 NOTE — Progress Notes (Signed)
Office Visit Note  Patient: Cristian Weiss             Date of Birth: 09/15/1990           MRN: [id number]             PCP: Jolene Provost, MD Referring: Jolene Provost, MD Visit Date: 12/07/2019 Occupation: @GUAROCC @  Subjective:  Medication monitoring  History of Present Illness: Cristian Weiss is a 29 y.o. male with history of Behcet's and osteoarthritis.  He is taking imuran 50 mg 3 tablets by mouth daily and colchicine 0.6 mg 1 capsule by mouth daily. He has tapered off of prednisone without difficulty. He is tolerating both Imuran and colchicine without any side effects.  Patient denies any recent flares of the shots.  He has not had any recent oral or genital ulcerations.  He has not had any recent rashes.  He experiences occasional discomfort in his lower back and bilateral knee joints.  He denies any joint swelling recently.  He has not needed to use Voltaren gel recently.  He will occasionally take Tylenol for pain relief if the pain is severe. He attributes most of his joint pain due to strenuous activities at work.  He denies any new concerns at this time.  Patient was diagnosed with acute bronchitis on 11/21/2019.  He was treated with doxycycline at that time.  He held Imuran for about 10 days.  He did not notice any new or worsening symptoms while off of Imuran during that time.  Activities of Daily Living:  Patient reports morning stiffness for  0  minutes.   Patient Denies nocturnal pain.  Difficulty dressing/grooming: Denies Difficulty climbing stairs: Denies Difficulty getting out of chair: Denies Difficulty using hands for taps, buttons, cutlery, and/or writing: Denies  Review of Systems  Constitutional: Negative for fatigue and night sweats.  HENT: Negative for mouth sores, mouth dryness and nose dryness.   Eyes: Negative for redness and dryness.  Respiratory: Negative for shortness of breath and difficulty breathing.   Cardiovascular: Negative for chest pain,  palpitations, hypertension, irregular heartbeat and swelling in legs/feet.  Gastrointestinal: Negative for constipation and diarrhea.  Endocrine: Negative for increased urination.  Genitourinary: Negative for painful urination.  Musculoskeletal: Positive for arthralgias and joint pain. Negative for joint swelling, myalgias, muscle weakness, morning stiffness, muscle tenderness and myalgias.  Skin: Negative for color change, rash, hair loss, nodules/bumps, skin tightness, ulcers and sensitivity to sunlight.  Allergic/Immunologic: Negative for susceptible to infections.  Neurological: Negative for dizziness, fainting, memory loss, night sweats and weakness.  Hematological: Negative for swollen glands.  Psychiatric/Behavioral: Negative for depressed mood and sleep disturbance. The patient is not nervous/anxious.     PMFS History:  Patient Active Problem List   Diagnosis Date Noted  . 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 Primary osteoarthritis of both knees 08/15/2018  . Lumbar facet arthropathy 08/15/2018  . Vitamin D deficiency 07/17/2018  . Long term (current) use of systemic steroids 07/17/2018  . Chronic right shoulder pain 07/17/2018  . Chronic midline low back pain without sciatica 07/17/2018  . Oral ulceration 07/08/2018  . Pharyngitis   . Fever 07/07/2018  . Maculopapular rash, generalized 07/07/2018  . SIRS (systemic inflammatory response syndrome) (HCC) 07/07/2018    Past Medical History:  Diagnosis Date  . ADHD (attention deficit hyperactivity disorder)   . Behcet's disease (HCC)   . Dyslexia     Family History  Problem Relation Age of Onset  . Diabetes Mellitus  II Father   . Healthy Son    Past Surgical History:  Procedure Laterality Date  . HAND SURGERY Right    Social History   Social History Narrative  . Not on file   Immunization History  Administered Date(s) Administered  . Hepatitis B 02/24/2011, 03/29/2011  . Influenza-Unspecified 03/04/2018  . Td  12/28/2015     Objective: Vital Signs: BP 130/80   Pulse 61   Resp 17   Ht 6\' 3"  (1.905 m)   Wt 198 lb (89.8 kg)   BMI 24.75 kg/m    Physical Exam Vitals and nursing note reviewed.  Constitutional:      Appearance: He is well-developed.  HENT:     Head: Normocephalic and atraumatic.  Eyes:     Conjunctiva/sclera: Conjunctivae normal.     Pupils: Pupils are equal, round, and reactive to light.  Pulmonary:     Effort: Pulmonary effort is normal.  Abdominal:     General: Bowel sounds are normal.     Palpations: Abdomen is soft.  Musculoskeletal:     Cervical back: Normal range of motion and neck supple.  Skin:    General: Skin is warm and dry.     Capillary Refill: Capillary refill takes less than 2 seconds.  Neurological:     Mental Status: He is alert and oriented to person, place, and time.  Psychiatric:        Behavior: Behavior normal.      Musculoskeletal Exam: C-spine, thoracic spine, and lumbar spine good ROM with no discomfort.  No midline spinal tenderness.  No SI joint tenderness.  Shoulder joints, elbow joints, wrist joints, MCPs, PIPs, and DIPs good ROM with no synovitis.  Complete fist formation bilaterally.  Hip joints, knee joints, and ankle joints good ROM with no synovitis.  No warmth or effusion of knee joints.  No tenderness or swelling of ankle joints.    CDAI Exam: CDAI Score: -- Patient Global: --; Provider Global: -- Swollen: --; Tender: -- Joint Exam 12/07/2019   No joint exam has been documented for this visit   There is currently no information documented on the homunculus. Go to the Rheumatology activity and complete the homunculus joint exam.  Investigation: No additional findings.  Imaging: No results found.  Recent Labs: Lab Results  Component Value Date   WBC 10.2 11/14/2019   HGB 15.2 11/14/2019   PLT 231 11/14/2019   NA 140 11/14/2019   K 4.1 11/14/2019   CL 104 11/14/2019   CO2 26 11/14/2019   GLUCOSE 115 11/14/2019    BUN 12 11/14/2019   CREATININE 0.95 11/14/2019   BILITOT 0.5 11/14/2019   ALKPHOS 47 07/07/2018   AST 17 11/14/2019   ALT 20 11/14/2019   PROT 7.0 11/14/2019   ALBUMIN 4.0 07/07/2018   CALCIUM 9.2 11/14/2019   GFRAA 125 11/14/2019   QFTBGOLDPLUS NEGATIVE 07/11/2018    Speciality Comments: No specialty comments available.  Procedures:  No procedures performed Allergies: Amoxicillin and Penicillins   Assessment / Plan:     Visit Diagnoses: Behcet's disease (HCC): He has not had any signs or symptoms of a flare recently.  He has not had any recent oral or genital ulcerations.  He has not had any recent rashes.  He has no signs of inflammatory arthritis at this time.  No synovitis was noted.  He experiences occasional discomfort in both knee joints but has good range of motion with no warmth or effusion on exam.  He is  clinically doing well on Imuran 150 mg daily and colchicine 0.6 mg 1 capsule by mouth daily.  He has tapered off of prednisone recurrence of symptoms.  He is tolerating Imuran and colchicine without any side effects.  He was diagnosed with acute bronchitis on 11/21/2019 and was treated with doxycycline.  He held Imuran for about 10 days and did not develop signs or symptoms of a flare during that time.  He will continue on the current treatment regimen.  A refill of Imuran was sent to the pharmacy.  He was provided samples for colchicine.  He was advised to notify us if he develops signs or symptoms of a flare.  He will follow-up in the office in 5 months.  High risk medication use - Imuran 150 mg po daily and colchicine 0.6 mg 1 capsule daily.  CBC and CMP were within normal limits on 11/14/2019.  He will be due to update lab work in September and every 3 months.  Standing orders for CBC and CMP are in place.  He is aware that he is to hold Imuran if he develops signs or symptoms of an infection and to resume once the infection has completely cleared.  Long term (current) use of  systemic steroids: He has tapered off of prednisone without difficulty.  Vitamin D deficiency: He is taking Replesta 50,000 units once monthly.  Vitamin D was 77 on 11/28/18.  Future order for vitamin D will be placed today.   Primary osteoarthritis of both knees: He has good ROM of the both knee joints on exam.  Right knee crepitus noted.  No warmth or effusion.  He has occasional discomfort in both knee joints, which attributes to strenuous activities at work.  He has voltaren gel which he can apply topically as needed for pain relief.  He takes tylenol sparingly for severe pain relief.   Lumbar facet arthropathy: He has occasional discomfort in his lower back.  No symptoms of radiculopathy.  He has good ROM with no discomfort. No midline spinal tenderness or SI joint tenderness was noted on exam.   Muscle cramps: He has not had any recent muscle cramps or muscle spasms.   Maculopapular rash, generalized: Resolved. He has not had any recent rashes.   Orders: Orders Placed This Encounter  Procedures  . VITAMIN D 25 Hydroxy (Vit-D Deficiency, Fractures)   Meds ordered this encounter  Medications  . azaTHIOprine (IMURAN) 50 MG tablet    Sig: Take 3 tablets (150 mg total) by mouth daily.    Dispense:  270 tablet    Refill:  0     Follow-Up Instructions: Return in about 5 months (around 05/08/2020) for Behcet's , Osteoarthritis.   Gearldine Bienenstock, PA-C  Note - This record has been created using Dragon software.  Chart creation errors have been sought, but may not always  have been located. Such creation errors do not reflect on  the standard of medical care.

## 2019-12-06 NOTE — Telephone Encounter (Addendum)
Medication Samples have been provided to the patient.  Drug name: MItigare    Strength: 0.6 mg      Qty: 6 LOT: [id number] Exp.Date: 12/2019  Dosing instructions: Take 0.6 mg by mouth daily.   The patient has been instructed regarding the correct time, dose, and frequency of taking this medication, including desired effects and most common side effects.   Dulcy Fanny 10:23 AM 12/06/2019

## 2019-12-07 ENCOUNTER — Ambulatory Visit (INDEPENDENT_AMBULATORY_CARE_PROVIDER_SITE_OTHER): Payer: BC Managed Care – PPO | Admitting: Physician Assistant

## 2019-12-07 ENCOUNTER — Encounter: Payer: Self-pay | Admitting: Physician Assistant

## 2019-12-07 ENCOUNTER — Other Ambulatory Visit: Payer: Self-pay

## 2019-12-07 VITALS — BP 130/80 | HR 61 | Resp 17 | Ht 75.0 in | Wt 198.0 lb

## 2019-12-07 DIAGNOSIS — M352 Behcet's disease: Secondary | ICD-10-CM

## 2019-12-07 DIAGNOSIS — M170 Bilateral primary osteoarthritis of knee: Secondary | ICD-10-CM

## 2019-12-07 DIAGNOSIS — Z79899 Other long term (current) drug therapy: Secondary | ICD-10-CM | POA: Diagnosis not present

## 2019-12-07 DIAGNOSIS — R252 Cramp and spasm: Secondary | ICD-10-CM

## 2019-12-07 DIAGNOSIS — Z7952 Long term (current) use of systemic steroids: Secondary | ICD-10-CM | POA: Diagnosis not present

## 2019-12-07 DIAGNOSIS — E559 Vitamin D deficiency, unspecified: Secondary | ICD-10-CM | POA: Diagnosis not present

## 2019-12-07 DIAGNOSIS — M47816 Spondylosis without myelopathy or radiculopathy, lumbar region: Secondary | ICD-10-CM

## 2019-12-07 DIAGNOSIS — R21 Rash and other nonspecific skin eruption: Secondary | ICD-10-CM

## 2019-12-07 MED ORDER — AZATHIOPRINE 50 MG PO TABS: 150.0000 mg | ORAL_TABLET | Freq: Every day | ORAL | 0 refills | Status: DC

## 2019-12-07 NOTE — Patient Instructions (Signed)
Standing Labs We placed an order today for your standing lab work.   Please have your standing labs drawn in September and every 3 months  If possible, please have your labs drawn 2 weeks prior to your appointment so that the provider can discuss your results at your appointment.  We have open lab daily Monday through Thursday from 8:30-12:30 PM and 1:30-4:30 PM and Friday from 8:30-12:30 PM and 1:30-4:00 PM at the office of Dr. Shaili Deveshwar,  Rheumatology.   Please be advised, patients with office appointments requiring lab work will take precedents over walk-in lab work.  If possible, please come for your lab work on Monday and Friday afternoons, as you may experience shorter wait times. The office is located at [address], [address], [place], [place] [address] No appointment is necessary.   Labs are drawn by [facility]. Please bring your co-pay at the time of your lab draw.  You may receive a bill from [facility] for your lab work.  If you wish to have your labs drawn at another location, please call the office 24 hours in advance to send orders.  If you have any questions regarding directions or hours of operation,  please call [phone].   As a reminder, please drink plenty of water prior to coming for your lab work. Thanks!  

## 2019-12-11 MED FILL — BENZONATATE 200 MG CAPS: 200 | 7 days supply | Qty: 20 | Fill #0

## 2019-12-11 MED FILL — DOXYCYCLINE HYCLATE 100 MG: 100 | 14 days supply | Qty: 28 | Fill #0

## 2019-12-14 MED FILL — FLUoxetine HCL 20 MG CAPS: 20 | 30 days supply | Qty: 30 | Fill #1

## 2020-02-05 MED FILL — FLUoxetine HCL 20 MG CAPS: 20 | 30 days supply | Qty: 30 | Fill #2

## 2020-04-03 ENCOUNTER — Other Ambulatory Visit (HOSPITAL_COMMUNITY): Payer: Self-pay | Admitting: Family Medicine

## 2020-04-03 MED FILL — FLUoxetine HCL 20 MG CAPS: 20 | 90 days supply | Qty: 90 | Fill #0

## 2020-04-03 MED FILL — buPROPion HCL ER (XL) 150 M: 150 | 30 days supply | Qty: 30 | Fill #0

## 2020-04-09 ENCOUNTER — Other Ambulatory Visit: Payer: Self-pay | Admitting: *Deleted

## 2020-04-09 DIAGNOSIS — Z79899 Other long term (current) drug therapy: Secondary | ICD-10-CM

## 2020-04-09 DIAGNOSIS — E559 Vitamin D deficiency, unspecified: Secondary | ICD-10-CM

## 2020-04-09 MED ORDER — COLCHICINE 0.6 MG PO CAPS: 0.6000 mg | ORAL_CAPSULE | Freq: Every day | ORAL | 0 refills | Status: DC

## 2020-04-09 NOTE — Progress Notes (Signed)
Last Visit: 12/07/2019 Next Visit: 05/09/2020 Labs: 11/14/2019 CBC and CMP are normal.  Current Dose per office note on 12/07/2019: colchicine 0.6 mg 1 capsule daily.  Okay to refill per Dr. Corliss Skains

## 2020-04-11 LAB — CBC WITH DIFFERENTIAL/PLATELET
Absolute Monocytes: 510 cells/uL (ref 200–950)
Basophils Absolute: 30 cells/uL (ref 0–200)
Basophils Relative: 0.6 %
Eosinophils Absolute: 110 cells/uL (ref 15–500)
Eosinophils Relative: 2.2 %
HCT: 46.6 % (ref 38.5–50.0)
Hemoglobin: 16.5 g/dL (ref 13.2–17.1)
Lymphs Abs: 1475 cells/uL (ref 850–3900)
MCH: 31.8 pg (ref 27.0–33.0)
MCHC: 35.4 g/dL (ref 32.0–36.0)
MCV: 89.8 fL (ref 80.0–100.0)
MPV: 9.9 fL (ref 7.5–12.5)
Monocytes Relative: 10.2 %
Neutro Abs: 2875 cells/uL (ref 1500–7800)
Neutrophils Relative %: 57.5 %
Platelets: 275 10*3/uL (ref 140–400)
RBC: 5.19 10*6/uL (ref 4.20–5.80)
RDW: 13 % (ref 11.0–15.0)
Total Lymphocyte: 29.5 %
WBC: 5 10*3/uL (ref 3.8–10.8)

## 2020-04-11 LAB — COMPLETE METABOLIC PANEL WITH GFR
AG Ratio: 1.7 (calc) (ref 1.0–2.5)
ALT: 21 U/L (ref 9–46)
AST: 21 U/L (ref 10–40)
Albumin: 4.6 g/dL (ref 3.6–5.1)
Alkaline phosphatase (APISO): 53 U/L (ref 36–130)
BUN: 15 mg/dL (ref 7–25)
CO2: 29 mmol/L (ref 20–32)
Calcium: 9.7 mg/dL (ref 8.6–10.3)
Chloride: 104 mmol/L (ref 98–110)
Creat: 1.04 mg/dL (ref 0.60–1.35)
GFR, Est African American: 112 mL/min/{1.73_m2} (ref >=60)
GFR, Est Non African American: 97 mL/min/{1.73_m2} (ref >=60)
Globulin: 2.7 g/dL (calc) (ref 1.9–3.7)
Glucose, Bld: 112 mg/dL (ref 65–139)
Potassium: 4.4 mmol/L (ref 3.5–5.3)
Sodium: 138 mmol/L (ref 135–146)
Total Bilirubin: 0.9 mg/dL (ref 0.2–1.2)
Total Protein: 7.3 g/dL (ref 6.1–8.1)

## 2020-04-11 LAB — VITAMIN D 25 HYDROXY (VIT D DEFICIENCY, FRACTURES): Vit D, 25-Hydroxy: 30 ng/mL (ref 30–100)

## 2020-04-11 NOTE — Progress Notes (Signed)
Vitamin D is low normal.  Please advise the patient to continue to take a maintenance dose of vitamin D.

## 2020-04-11 NOTE — Progress Notes (Signed)
CBC and CMP are normal.

## 2020-04-27 NOTE — Progress Notes (Deleted)
Office Visit Note  Patient: Cristian Weiss             Date of Birth: 23-Oct-1990           MRN: [id number]             PCP: Cristian Provost, MD Referring: Cristian Provost, MD Visit Date: 05/09/2020 Occupation: @GUAROCC @  Subjective:  No chief complaint on file.   History of Present Illness: Cristian Weiss is a 29 y.o. male ***   Activities of Daily Living:  Patient reports morning stiffness for *** {minute/hour:19697}.   Patient {ACTIONS;DENIES/REPORTS:21021675::"Denies"} nocturnal pain.  Difficulty dressing/grooming: {ACTIONS;DENIES/REPORTS:21021675::"Denies"} Difficulty climbing stairs: {ACTIONS;DENIES/REPORTS:21021675::"Denies"} Difficulty getting out of chair: {ACTIONS;DENIES/REPORTS:21021675::"Denies"} Difficulty using hands for taps, buttons, cutlery, and/or writing: {ACTIONS;DENIES/REPORTS:21021675::"Denies"}  No Rheumatology ROS completed.   PMFS History:  Patient Active Problem List   Diagnosis Date Noted  . 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 Primary osteoarthritis of both knees 08/15/2018  . Lumbar facet arthropathy 08/15/2018  . Vitamin D deficiency 07/17/2018  . Long term (current) use of systemic steroids 07/17/2018  . Chronic right shoulder pain 07/17/2018  . Chronic midline low back pain without sciatica 07/17/2018  . Oral ulceration 07/08/2018  . Pharyngitis   . Fever 07/07/2018  . Maculopapular rash, generalized 07/07/2018  . SIRS (systemic inflammatory response syndrome) (HCC) 07/07/2018    Past Medical History:  Diagnosis Date  . ADHD (attention deficit hyperactivity disorder)   . Behcet's disease (HCC)   . Dyslexia     Family History  Problem Relation Age of Onset  . Diabetes Mellitus II Father   . Healthy Son    Past Surgical History:  Procedure Laterality Date  . HAND SURGERY Right    Social History   Social History Narrative  . Not on file   Immunization History  Administered Date(s) Administered  . Hepatitis B 02/24/2011,  03/29/2011  . Influenza-Unspecified 03/04/2018  . Td 12/28/2015     Objective: Vital Signs: There were no vitals taken for this visit.   Physical Exam   Musculoskeletal Exam: ***  CDAI Exam: CDAI Score: -- Patient Global: --; Provider Global: -- Swollen: --; Tender: -- Joint Exam 05/09/2020   No joint exam has been documented for this visit   There is currently no information documented on the homunculus. Go to the Rheumatology activity and complete the homunculus joint exam.  Investigation: No additional findings.  Imaging: No results found.  Recent Labs: Lab Results  Component Value Date   WBC 5.0 04/10/2020   HGB 16.5 04/10/2020   PLT 275 04/10/2020   NA 138 04/10/2020   K 4.4 04/10/2020   CL 104 04/10/2020   CO2 29 04/10/2020   GLUCOSE 112 04/10/2020   BUN 15 04/10/2020   CREATININE 1.04 04/10/2020   BILITOT 0.9 04/10/2020   ALKPHOS 47 07/07/2018   AST 21 04/10/2020   ALT 21 04/10/2020   PROT 7.3 04/10/2020   ALBUMIN 4.0 07/07/2018   CALCIUM 9.7 04/10/2020   GFRAA 112 04/10/2020   QFTBGOLDPLUS NEGATIVE 07/11/2018    Speciality Comments: No specialty comments available.  Procedures:  No procedures performed Allergies: Amoxicillin and Penicillins   Assessment / Plan:     Visit Diagnoses: Behcet's disease (HCC)  High risk medication use  Primary osteoarthritis of both knees  Lumbar facet arthropathy  Muscle cramps  Chronic right shoulder pain  Maculopapular rash, generalized  Vitamin D deficiency  Orders: No orders of the defined types were placed in this encounter.  No  orders of the defined types were placed in this encounter.   Face-to-face time spent with patient was *** minutes. Greater than 50% of time was spent in counseling and coordination of care.  Follow-Up Instructions: No follow-ups on file.   Gearldine Bienenstock, PA-C  Note - This record has been created using Dragon software.  Chart creation errors have been sought, but  may not always  have been located. Such creation errors do not reflect on  the standard of medical care.

## 2020-04-30 ENCOUNTER — Other Ambulatory Visit: Payer: Self-pay

## 2020-04-30 MED ORDER — AZATHIOPRINE 50 MG PO TABS
150.0000 mg | ORAL_TABLET | Freq: Every day | ORAL | 0 refills | Status: DC
Start: 2020-04-30 — End: 2020-09-17

## 2020-04-30 NOTE — Telephone Encounter (Signed)
Last Visit: 12/07/2019 Next Visit: 05/16/2020 Labs: 04/10/2020 CBC and CMP are normal.  Current Dose per office note 12/07/2019: Imuran 150 mg po daily  DX:  Behcet's disease   Okay to refill per Dr. Corliss Skains

## 2020-05-05 NOTE — Progress Notes (Signed)
Office Visit Note  Patient: Cristian Weiss             Date of Birth: 12/04/1990           MRN: [id number]             PCP: Jolene Provost, MD Referring: Jolene Provost, MD Visit Date: 05/16/2020 Occupation: @GUAROCC @  Subjective:  Medication monitoring   History of Present Illness: Cristian Weiss is a 29 y.o. male with history of behcet's and osteoarthritis. He takes imuran 150 mg daily and colchicine 0.6 mg daily.  He is tolerating both medications without any side effects. He typically takes his medications in the morning but he forgot to take them yesterday. He denies any signs or symptoms of a behcet's flare.  He has not had any recurrence of oral or genital ulcers.  He denies any recent rashes.  He denies any eye pain, photophobia, or redness lately.  He has noticed some increased arthralgias in both knees and both ankles with cooler weather temperatures. He denies any joint swelling. He works a physically demanding job for 10 hours a day 5 days a week, which exacerbates his discomfort in the evenings.  He experiences occasional discomfort in his lower back.  He has not been performing any stretching exercises.  He typically does not take OTC products for pain relief or use voltaren gel topically.  He continues to take replesta 50,000 units every 30 days.    Activities of Daily Living:  Patient reports morning stiffness for 0 minutes.   Patient Denies nocturnal pain.  Difficulty dressing/grooming: Denies Difficulty climbing stairs: Denies Difficulty getting out of chair: Denies Difficulty using hands for taps, buttons, cutlery, and/or writing: Denies  Review of Systems  Constitutional: Negative for fatigue.  HENT: Negative for mouth sores, mouth dryness and nose dryness.   Eyes: Negative for pain, itching, visual disturbance and dryness.  Respiratory: Positive for cough. Negative for hemoptysis, shortness of breath and difficulty breathing.   Cardiovascular: Negative for chest  pain, palpitations and swelling in legs/feet.  Gastrointestinal: Negative for abdominal pain, blood in stool, constipation and diarrhea.  Endocrine: Negative for increased urination.  Genitourinary: Negative for painful urination.  Musculoskeletal: Negative for arthralgias, joint pain, joint swelling, myalgias, muscle weakness, morning stiffness, muscle tenderness and myalgias.  Skin: Negative for color change, rash and redness.  Allergic/Immunologic: Negative for susceptible to infections.  Neurological: Negative for dizziness, numbness, headaches, memory loss and weakness.  Hematological: Negative for swollen glands.  Psychiatric/Behavioral: Negative for confusion and sleep disturbance.    PMFS History:  Patient Active Problem List   Diagnosis Date Noted  . 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 Primary osteoarthritis of both knees 08/15/2018  . Lumbar facet arthropathy 08/15/2018  . Vitamin D deficiency 07/17/2018  . Long term (current) use of systemic steroids 07/17/2018  . Chronic right shoulder pain 07/17/2018  . Chronic midline low back pain without sciatica 07/17/2018  . Oral ulceration 07/08/2018  . Pharyngitis   . Fever 07/07/2018  . Maculopapular rash, generalized 07/07/2018  . SIRS (systemic inflammatory response syndrome) (HCC) 07/07/2018    Past Medical History:  Diagnosis Date  . ADHD (attention deficit hyperactivity disorder)   . Behcet's disease (HCC)   . Dyslexia     Family History  Problem Relation Age of Onset  . Diabetes Mellitus II Father   . Thyroid disease Father   . Asthma Father   . Hypertension Father   . Healthy Son  Past Surgical History:  Procedure Laterality Date  . HAND SURGERY Right   . SKIN BIOPSY  2020   Social History   Social History Narrative  . Not on file   Immunization History  Administered Date(s) Administered  . Hepatitis B 02/24/2011, 03/29/2011  . Influenza-Unspecified 03/04/2018  . Moderna Sars-Covid-2 Vaccination  01/21/2020, 02/18/2020  . Td 12/28/2015     Objective: Vital Signs: BP 129/83 (BP Location: Left Arm, Patient Position: Sitting, Cuff Size: Normal)   Pulse 77   Ht 6\' 3"  (1.905 m)   Wt 199 lb 3.2 oz (90.4 kg)   BMI 24.90 kg/m    Physical Exam Vitals and nursing note reviewed.  Constitutional:      Appearance: He is well-developed and well-nourished.  HENT:     Head: Normocephalic and atraumatic.  Eyes:     Extraocular Movements: EOM normal.     Conjunctiva/sclera: Conjunctivae normal.     Pupils: Pupils are equal, round, and reactive to light.  Pulmonary:     Effort: Pulmonary effort is normal.  Abdominal:     Palpations: Abdomen is soft.  Musculoskeletal:     Cervical back: Normal range of motion and neck supple.  Skin:    General: Skin is warm and dry.     Capillary Refill: Capillary refill takes less than 2 seconds.  Neurological:     Mental Status: He is alert and oriented to person, place, and time.  Psychiatric:        Mood and Affect: Mood and affect normal.        Behavior: Behavior normal.      Musculoskeletal Exam: C-spine, thoracic spine, and lumbar spine good ROM.  No midline spinal tenderness.  No SI joint tenderness to palpation.  Shoulder joints, elbow joints, wrist joints, MCPs, PIPs, and DIPs good ROM with no synovitis.  Complete fist formation bilaterally.  Hip joints, knee joints, and ankle joints good ROM with no discomfort.  No warmth or effusion of knee joints noted.   CDAI Exam: CDAI Score: -- Patient Global: --; Provider Global: -- Swollen: --; Tender: -- Joint Exam 05/16/2020   No joint exam has been documented for this visit   There is currently no information documented on the homunculus. Go to the Rheumatology activity and complete the homunculus joint exam.  Investigation: No additional findings.  Imaging: No results found.  Recent Labs: Lab Results  Component Value Date   WBC 5.0 04/10/2020   HGB 16.5 04/10/2020   PLT 275  04/10/2020   NA 138 04/10/2020   K 4.4 04/10/2020   CL 104 04/10/2020   CO2 29 04/10/2020   GLUCOSE 112 04/10/2020   BUN 15 04/10/2020   CREATININE 1.04 04/10/2020   BILITOT 0.9 04/10/2020   ALKPHOS 47 07/07/2018   AST 21 04/10/2020   ALT 21 04/10/2020   PROT 7.3 04/10/2020   ALBUMIN 4.0 07/07/2018   CALCIUM 9.7 04/10/2020   GFRAA 112 04/10/2020   QFTBGOLDPLUS NEGATIVE 07/11/2018    Speciality Comments: No specialty comments available.  Procedures:  No procedures performed Allergies: Amoxicillin and Penicillins      Assessment / Plan:     Visit Diagnoses: Behcet's disease (HCC): No recurrence of symptoms on current treatment regimen.  He is clinically doing well on Imuran 150 mg daily and colchicine 0.6 mg daily.  He is tolerating both medications without any side effects.  He has not had any oral or genital ulcerations, rash, eye inflammation, or inflammatory arthritis.  He  experiences occasional arthralgias but no synovitis was noted on exam.  He will continue on the current treatment regimen.  He does not need any refills at this time.  He was advised to notify us if he develops any new or worsening symptoms.  He will follow up in the office in 5 months.  High risk medication use - Imuran 150 mg daily and mitigare 0.6 mg daily. CBC and CMP WNL on 04/10/20.  He will be due to update lab work in February and every 3 months to monitor for drug toxicity.  Standing orders for CBC and CMP remain in place.  He has not had any recent infections. He is aware that he will need to hold imuran if he develops signs or symptoms of an infection and to resume once the infection has cleared. He has received both covid-19 vaccine doses.  Primary osteoarthritis of both knees: He has good ROM of both knee joints with no discomfort.  No warmth or effusion noted on exam.  He has noticed some increased discomfort and stiffness in both knees with cooler weather temperatures.  His knee joint pain usually  improves as the day goes on.  He can use voltaren gel topically as needed for pain relief.   Lumbar facet arthropathy: He experiences intermittent lower back pain and stiffness.  No symptoms of radiculopathy.  No midline spinal tenderness or SI joint tenderness on exam today.  He has a physically demanding job and works 10 hours days x5 days a week, which exacerbates his discomfort. Recommended the use of a foam roller. He was encouraged to perform back exercises daily. He has not needed to take any OTC products for pain relief.   Muscle cramps: He experiences occasional hand cramping with strenuous activity. He has no joint tenderness or synovitis on his hands at this time. He is able to make a complete fist bilaterally.   He has not been experiencing any other muscle cramps recently.   Vitamin D deficiency -Vitamin D was 30 on 04/10/20.  He will continue to take replesta 50,000 units once monthly.  Orders: No orders of the defined types were placed in this encounter.  No orders of the defined types were placed in this encounter.    Follow-Up Instructions: Return in about 5 months (around 10/14/2020) for Behcet's, Osteoarthritis.   Gearldine Bienenstock, PA-C  Note - This record has been created using Dragon software.  Chart creation errors have been sought, but may not always  have been located. Such creation errors do not reflect on  the standard of medical care.

## 2020-05-09 ENCOUNTER — Ambulatory Visit: Payer: BC Managed Care – PPO | Admitting: Physician Assistant

## 2020-05-16 ENCOUNTER — Other Ambulatory Visit: Payer: Self-pay

## 2020-05-16 ENCOUNTER — Ambulatory Visit (INDEPENDENT_AMBULATORY_CARE_PROVIDER_SITE_OTHER): Payer: 59 | Admitting: Physician Assistant

## 2020-05-16 ENCOUNTER — Encounter: Payer: Self-pay | Admitting: Physician Assistant

## 2020-05-16 VITALS — BP 129/83 | HR 77 | Ht 75.0 in | Wt 199.2 lb

## 2020-05-16 DIAGNOSIS — M352 Behcet's disease: Secondary | ICD-10-CM | POA: Diagnosis not present

## 2020-05-16 DIAGNOSIS — M170 Bilateral primary osteoarthritis of knee: Secondary | ICD-10-CM | POA: Diagnosis not present

## 2020-05-16 DIAGNOSIS — Z79899 Other long term (current) drug therapy: Secondary | ICD-10-CM

## 2020-05-16 DIAGNOSIS — R252 Cramp and spasm: Secondary | ICD-10-CM

## 2020-05-16 DIAGNOSIS — E559 Vitamin D deficiency, unspecified: Secondary | ICD-10-CM

## 2020-05-16 DIAGNOSIS — M47816 Spondylosis without myelopathy or radiculopathy, lumbar region: Secondary | ICD-10-CM

## 2020-05-16 NOTE — Patient Instructions (Signed)
Standing Labs We placed an order today for your standing lab work.   Please have your standing labs drawn in February and every 3 months    If possible, please have your labs drawn 2 weeks prior to your appointment so that the provider can discuss your results at your appointment.  We have open lab daily Monday through Thursday from 8:30-12:30 PM and 1:30-4:30 PM and Friday from 8:30-12:30 PM and 1:30-4:00 PM at the office of Dr. Shaili Deveshwar, [facility].   Please be advised, patients with office appointments requiring lab work will take precedents over walk-in lab work.  If possible, please come for your lab work on Monday and Friday afternoons, as you may experience shorter wait times. The office is located at [address], [address], [place], [place] [address] No appointment is necessary.   Labs are drawn by [facility]. Please bring your co-pay at the time of your lab draw.  You may receive a bill from [facility] for your lab work.  If you wish to have your labs drawn at another location, please call the office 24 hours in advance to send orders.  If you have any questions regarding directions or hours of operation,  please call [phone].   As a reminder, please drink plenty of water prior to coming for your lab work. Thanks!   

## 2020-06-15 ENCOUNTER — Other Ambulatory Visit: Payer: Self-pay | Admitting: Rheumatology

## 2020-06-16 NOTE — Telephone Encounter (Signed)
Last Visit: 05/16/2020 Next Visit: 10/17/2020 Labs: 04/10/2020, CBC and CMP are normal.  Current Dose per office note 05/16/2020, mitigare 0.6 mg daily. DX: Behcet's disease   Okay to refill Mitigare?

## 2020-07-06 ENCOUNTER — Other Ambulatory Visit: Payer: Self-pay | Admitting: Rheumatology

## 2020-07-09 ENCOUNTER — Telehealth: Payer: Self-pay | Admitting: *Deleted

## 2020-07-09 NOTE — Telephone Encounter (Signed)
Medication Samples have been provided to the  patient.  Drug name: Mitigare Strength: 0.6 mg Qty:3 LOT: [id number] Exp.Date: 07/28/2020  Dosing instructions: Take 1 capsule by mouth daily

## 2020-08-15 ENCOUNTER — Other Ambulatory Visit: Payer: Self-pay | Admitting: Physician Assistant

## 2020-08-15 DIAGNOSIS — Z79899 Other long term (current) drug therapy: Secondary | ICD-10-CM

## 2020-08-16 LAB — CBC WITH DIFFERENTIAL/PLATELET
Absolute Monocytes: 1106 cells/uL — ABNORMAL HIGH (ref 200–950)
Basophils Absolute: 49 cells/uL (ref 0–200)
Basophils Relative: 0.7 %
Eosinophils Absolute: 63 cells/uL (ref 15–500)
Eosinophils Relative: 0.9 %
HCT: 45.6 % (ref 38.5–50.0)
Hemoglobin: 16 g/dL (ref 13.2–17.1)
Lymphs Abs: 917 cells/uL (ref 850–3900)
MCH: 31.9 pg (ref 27.0–33.0)
MCHC: 35.1 g/dL (ref 32.0–36.0)
MCV: 90.8 fL (ref 80.0–100.0)
MPV: 10.2 fL (ref 7.5–12.5)
Monocytes Relative: 15.8 %
Neutro Abs: 4865 cells/uL (ref 1500–7800)
Neutrophils Relative %: 69.5 %
Platelets: 250 10*3/uL (ref 140–400)
RBC: 5.02 10*6/uL (ref 4.20–5.80)
RDW: 13.3 % (ref 11.0–15.0)
Total Lymphocyte: 13.1 %
WBC: 7 10*3/uL (ref 3.8–10.8)

## 2020-08-16 LAB — COMPLETE METABOLIC PANEL WITH GFR
AG Ratio: 1.6 (calc) (ref 1.0–2.5)
ALT: 22 U/L (ref 9–46)
AST: 21 U/L (ref 10–40)
Albumin: 4.5 g/dL (ref 3.6–5.1)
Alkaline phosphatase (APISO): 57 U/L (ref 36–130)
BUN: 9 mg/dL (ref 7–25)
CO2: 28 mmol/L (ref 20–32)
Calcium: 9.2 mg/dL (ref 8.6–10.3)
Chloride: 103 mmol/L (ref 98–110)
Creat: 0.9 mg/dL (ref 0.60–1.35)
GFR, Est African American: 133 mL/min/{1.73_m2} (ref >=60)
GFR, Est Non African American: 115 mL/min/{1.73_m2} (ref >=60)
Globulin: 2.8 g/dL (calc) (ref 1.9–3.7)
Glucose, Bld: 91 mg/dL (ref 65–99)
Potassium: 3.4 mmol/L — ABNORMAL LOW (ref 3.5–5.3)
Sodium: 140 mmol/L (ref 135–146)
Total Bilirubin: 0.5 mg/dL (ref 0.2–1.2)
Total Protein: 7.3 g/dL (ref 6.1–8.1)

## 2020-09-17 ENCOUNTER — Other Ambulatory Visit: Payer: Self-pay | Admitting: Rheumatology

## 2020-09-17 MED ORDER — AZATHIOPRINE 50 MG PO TABS: 150.0000 mg | ORAL_TABLET | Freq: Every day | ORAL | 0 refills | Status: DC

## 2020-09-17 NOTE — Telephone Encounter (Signed)
Next Visit: 10/17/2020  Last Visit: 05/16/2020  Last Fill: 04/30/2020  DX: Behcet's disease   Current Dose per office note 04/30/2020, Imuran 150 mg daily   Labs: 08/15/2020,  Absolute monocytes are borderline elevated. Rest of CBC WNL. Potassium is borderline low-3.4. please notify the patient and forward lab work to PCP.   Okay to refill Imuran?

## 2020-09-18 ENCOUNTER — Other Ambulatory Visit: Payer: Self-pay | Admitting: Rheumatology

## 2020-10-17 ENCOUNTER — Ambulatory Visit: Payer: 59 | Admitting: Physician Assistant

## 2020-11-23 ENCOUNTER — Other Ambulatory Visit: Payer: Self-pay | Admitting: Physician Assistant

## 2020-12-05 ENCOUNTER — Ambulatory Visit: Payer: 59 | Admitting: Physician Assistant

## 2020-12-05 ENCOUNTER — Encounter: Payer: Self-pay | Admitting: Physician Assistant

## 2020-12-05 ENCOUNTER — Other Ambulatory Visit: Payer: Self-pay

## 2020-12-05 VITALS — BP 137/80 | HR 65 | Ht 75.0 in | Wt 201.0 lb

## 2020-12-05 DIAGNOSIS — Z79899 Other long term (current) drug therapy: Secondary | ICD-10-CM | POA: Diagnosis not present

## 2020-12-05 DIAGNOSIS — R21 Rash and other nonspecific skin eruption: Secondary | ICD-10-CM

## 2020-12-05 DIAGNOSIS — M47816 Spondylosis without myelopathy or radiculopathy, lumbar region: Secondary | ICD-10-CM

## 2020-12-05 DIAGNOSIS — M352 Behcet's disease: Secondary | ICD-10-CM

## 2020-12-05 DIAGNOSIS — M170 Bilateral primary osteoarthritis of knee: Secondary | ICD-10-CM

## 2020-12-05 DIAGNOSIS — R252 Cramp and spasm: Secondary | ICD-10-CM

## 2020-12-05 DIAGNOSIS — E559 Vitamin D deficiency, unspecified: Secondary | ICD-10-CM

## 2020-12-05 MED ORDER — COLCHICINE 0.6 MG PO CAPS
1.0000 | ORAL_CAPSULE | Freq: Every day | ORAL | 0 refills | Status: DC
Start: 2020-12-05 — End: 2021-02-13

## 2020-12-05 MED ORDER — AZATHIOPRINE 50 MG PO TABS: 150.0000 mg | ORAL_TABLET | Freq: Every day | ORAL | 0 refills | Status: DC

## 2020-12-05 NOTE — Progress Notes (Signed)
Office Visit Note  Patient: Cristian Weiss             Date of Birth: November 29, 1990           MRN: [id number]             PCP: Jolene Provost, MD Referring: Jolene Provost, MD Visit Date: 12/05/2020 Occupation: @GUAROCC @  Subjective:  Medication monitoring   History of Present Illness: Cristian Weiss is a 30 y.o. male with history of Behcet's disease and osteoarthritis.  He is taking imuran 50 mg 3 tablets by mouth daily and mitigare 0.6 mg 1 capsule by mouth daily.  He is tolerating both medications and has not missed any doses recently.  He denies any signs or symptoms of a flare.  He has not had any recent rashes.  He denies any oral or genital ulcers.  He denies any joint swelling or increased stiffness recently.  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  He has not noticed any increased muscle weakness.  He has had some increased neck stiffness which she attributes to posture while driving.  He has not needed to use Voltaren gel or take any NSAIDs recently for pain relief.  He denies any new concerns at this time. He denies any recent infections.    Activities of Daily Living:  Patient reports morning stiffness for 0 minutes.   Patient Denies nocturnal pain.  Difficulty dressing/grooming: Denies Difficulty climbing stairs: Denies Difficulty getting out of chair: Denies Difficulty using hands for taps, buttons, cutlery, and/or writing: Denies  Review of Systems  Constitutional:  Negative for fatigue.  HENT:  Negative for mouth sores, mouth dryness and nose dryness.   Eyes:  Negative for pain, itching, visual disturbance and dryness.  Respiratory:  Negative for cough, hemoptysis, shortness of breath and difficulty breathing.   Cardiovascular:  Negative for chest pain, palpitations and swelling in legs/feet.  Gastrointestinal:  Negative for abdominal pain, blood in stool, constipation  and diarrhea.  Endocrine: Negative for increased urination.  Genitourinary:  Negative for painful urination.  Musculoskeletal:  Negative for joint pain, joint pain, joint swelling, myalgias, muscle weakness, morning stiffness, muscle tenderness and myalgias.  Skin:  Negative for color change, rash and redness.  Allergic/Immunologic: Negative for susceptible to infections.  Neurological:  Negative for dizziness, numbness, headaches, memory loss and weakness.  Hematological:  Negative for swollen glands.  Psychiatric/Behavioral:  Negative for confusion and sleep disturbance.    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   Primary osteoarthritis of both knees 08/15/2018   Lumbar facet arthropathy 08/15/2018   Vitamin D deficiency 07/17/2018   Long term (current) use of systemic steroids 07/17/2018   Chronic right shoulder pain 07/17/2018   Chronic midline low back pain without sciatica 07/17/2018   Oral ulceration 07/08/2018   Pharyngitis    Fever 07/07/2018   Maculopapular rash, generalized 07/07/2018   SIRS (systemic inflammatory response syndrome) (HCC) 07/07/2018    Past Medical History:  Diagnosis Date   ADHD (attention deficit hyperactivity disorder)    Behcet's disease (HCC)    Dyslexia     Family History  Problem Relation Age of Onset   Diabetes Mellitus II Father    Thyroid disease Father    Asthma Father    Hypertension Father    Healthy Son    Past  Surgical History:  Procedure Laterality Date   HAND SURGERY Right    SKIN BIOPSY  2020   Social History   Social History Narrative   Not on file   Immunization History  Administered Date(s) Administered   Hepatitis B 02/24/2011, 03/29/2011   Influenza-Unspecified 03/04/2018   Moderna Sars-Covid-2 Vaccination 01/21/2020, 02/18/2020   Td 12/28/2015     Objective: Vital Signs: BP 137/80 (BP Location: Left Arm, Patient Position: Sitting, Cuff Size:  Normal)   Pulse 65   Ht 6\' 3"  (1.905 m)   Wt 201 lb (91.2 kg)   BMI 25.12 kg/m    Physical Exam Vitals and nursing note reviewed.  Constitutional:      Appearance: He is well-developed.  HENT:     Head: Normocephalic and atraumatic.  Eyes:     Conjunctiva/sclera: Conjunctivae normal.     Pupils: Pupils are equal, round, and reactive to light.  Pulmonary:     Effort: Pulmonary effort is normal.  Abdominal:     Palpations: Abdomen is soft.  Musculoskeletal:     Cervical back: Normal range of motion and neck supple.  Skin:    General: Skin is warm and dry.     Capillary Refill: Capillary refill takes less than 2 seconds.  Neurological:     Mental Status: He is alert and oriented to person, place, and time.  Psychiatric:        Behavior: Behavior normal.     Musculoskeletal Exam: C-spine has slightly limited range of motion with lateral rotation.  Postural thoracic kyphosis noted.  No midline spinal tenderness or SI joint tenderness was noted.  Shoulder joints, elbow joints, wrist joints, MCPs, PIPs, DIPs have good range of motion with no synovitis.  He was able to make a complete fist bilaterally.  Hip joints have good range of motion with no discomfort.  Knee joints have good range of motion with no warmth or effusion.  Ankle joints have good range of motion with no tenderness or joint swelling.  CDAI Exam: CDAI Score: -- Patient Global: --; Provider Global: -- Swollen: --; Tender: -- Joint Exam 12/05/2020   No joint exam has been documented for this visit   There is currently no information documented on the homunculus. Go to the Rheumatology activity and complete the homunculus joint exam.  Investigation: No additional findings.  Imaging: No results found.  Recent Labs: Lab Results  Component Value Date   WBC 7.0 08/15/2020   HGB 16.0 08/15/2020   PLT 250 08/15/2020   NA 140 08/15/2020   K 3.4 (L) 08/15/2020   CL 103 08/15/2020   CO2 28 08/15/2020   GLUCOSE  91 08/15/2020   BUN 9 08/15/2020   CREATININE 0.90 08/15/2020   BILITOT 0.5 08/15/2020   ALKPHOS 47 07/07/2018   AST 21 08/15/2020   ALT 22 08/15/2020   PROT 7.3 08/15/2020   ALBUMIN 4.0 07/07/2018   CALCIUM 9.2 08/15/2020   GFRAA 133 08/15/2020   QFTBGOLDPLUS NEGATIVE 07/11/2018    Speciality Comments: No specialty comments available.  Procedures:  No procedures performed Allergies: Amoxicillin and Penicillins   Assessment / Plan:     Visit Diagnoses: Behcet's disease (HCC) - History of oral ulcers, genital ulcers, rash, arthritis, fever, myalgias, eye inflammation. Prednisone responsive: He has not had any signs or symptoms of a flare.  He has not had any recent rashes, genital or oral ulcerations, or signs of inflammatory arthritis.  He has clinically been doing well taking Imuran 50 mg  3 tablets by mouth daily and Mitigare 0.6 mg 1 capsule by mouth daily.  He continues to tolerate both medications and has not missed any doses recently.  He will continue on the current treatment regimen.  Refills of both medications were sent to the pharmacy today.  He was advised to notify us if he develops any new or worsening symptoms.  He will follow-up in the office in 5 months.- Plan: Colchicine (MITIGARE) 0.6 MG CAPS, azaTHIOprine (IMURAN) 50 MG tablet  High risk medication use - Imuran 50 mg 3 tablets by mouth daily, Mitigare 0.6 mg 1 capsule by mouth daily.  CBC and CMP updated on 08/15/20.  He is due to update lab work today.  Orders released.  His next lab work will be due in October and every 3 months.  Standing orders for CBC and CMP remain in place.    - Plan: CBC with Differential/Platelet, COMPLETE METABOLIC PANEL WITH GFR  He has not had any recent infections.  Advised to hold imuran if he develops signs or symptoms of an infection and to resume once the infection has completely cleared.  Primary osteoarthritis of both knees - He has good range of motion of both knee joints on examination  today.  No warmth or effusion was noted.  He has occasional stiffness in both knees after sitting for prolonged periods of time.  He uses voltaren gel topically sparingly for pain relief.   Lumbar facet arthropathy: He has occasional discomfort in his lower back which is typically exacerbated by sitting for prolonged periods of time while driving long distances for work.  He has no midline spinal tenderness or SI joint tenderness on examination today.  No symptoms of radiculopathy.  We discussed the importance of stretching on a daily basis as well as using a foam roller to alleviate some of his pain and stiffness.  He can also try using Salonpas patches if his discomfort is exacerbated.  He was given a couple salonpas samples.   Muscle cramps: Has not been experiencing any muscle cramps, spasms, or muscle weakness recently.  Vitamin D deficiency - He takes Replesta 50,000 units once monthly.  Vitamin D level will be checked today.- Plan: VITAMIN D 25 Hydroxy (Vit-D Deficiency, Fractures)  Maculopapular rash, generalized: Resolved.  He has not had any recent rashes.  Orders: Orders Placed This Encounter  Procedures   CBC with Differential/Platelet   COMPLETE METABOLIC PANEL WITH GFR   VITAMIN D 25 Hydroxy (Vit-D Deficiency, Fractures)   Meds ordered this encounter  Medications   Colchicine (MITIGARE) 0.6 MG CAPS    Sig: Take 1 capsule by mouth daily.    Dispense:  90 capsule    Refill:  0   azaTHIOprine (IMURAN) 50 MG tablet    Sig: Take 3 tablets (150 mg total) by mouth daily.    Dispense:  270 tablet    Refill:  0      Follow-Up Instructions: Return in about 5 months (around 05/07/2021) for Behcet's disease, Osteoarthritis.   Gearldine Bienenstock, PA-C  Note - This record has been created using Dragon software.  Chart creation errors have been sought, but may not always  have been located. Such creation errors do not reflect on  the standard of medical care.

## 2020-12-05 NOTE — Patient Instructions (Signed)
Standing Labs We placed an order today for your standing lab work.   Please have your standing labs drawn in October and every 3 months   If possible, please have your labs drawn 2 weeks prior to your appointment so that the provider can discuss your results at your appointment.  Please note that you may see your imaging and lab results in MyChart before we have reviewed them. We may be awaiting multiple results to interpret others before contacting you. Please allow our office up to 72 hours to thoroughly review all of the results before contacting the office for clarification of your results.  We have open lab daily: Monday through Thursday from 1:30-4:30 PM and Friday from 1:30-4:00 PM at the office of Dr. Shaili Deveshwar, [facility].   Please be advised, all patients with office appointments requiring lab work will take precedent over walk-in lab work.  If possible, please come for your lab work on Monday and Friday afternoons, as you may experience shorter wait times. The office is located at [address], [address], [place], [place] [address] No appointment is necessary.   Labs are drawn by [facility]. Please bring your co-pay at the time of your lab draw.  You may receive a bill from [facility] for your lab work.  If you wish to have your labs drawn at another location, please call the office 24 hours in advance to send orders.  If you have any questions regarding directions or hours of operation,  please call [phone].   As a reminder, please drink plenty of water prior to coming for your lab work. Thanks!  

## 2020-12-06 LAB — COMPLETE METABOLIC PANEL WITH GFR
AG Ratio: 1.7 (calc) (ref 1.0–2.5)
ALT: 24 U/L (ref 9–46)
AST: 20 U/L (ref 10–40)
Albumin: 4.6 g/dL (ref 3.6–5.1)
Alkaline phosphatase (APISO): 54 U/L (ref 36–130)
BUN: 14 mg/dL (ref 7–25)
CO2: 30 mmol/L (ref 20–32)
Calcium: 9.5 mg/dL (ref 8.6–10.3)
Chloride: 103 mmol/L (ref 98–110)
Creat: 0.99 mg/dL (ref 0.60–1.35)
GFR, Est African American: 118 mL/min/{1.73_m2} (ref >=60)
GFR, Est Non African American: 102 mL/min/{1.73_m2} (ref >=60)
Globulin: 2.7 g/dL (calc) (ref 1.9–3.7)
Glucose, Bld: 102 mg/dL — ABNORMAL HIGH (ref 65–99)
Potassium: 4.4 mmol/L (ref 3.5–5.3)
Sodium: 138 mmol/L (ref 135–146)
Total Bilirubin: 0.4 mg/dL (ref 0.2–1.2)
Total Protein: 7.3 g/dL (ref 6.1–8.1)

## 2020-12-06 LAB — CBC WITH DIFFERENTIAL/PLATELET
Absolute Monocytes: 480 cells/uL (ref 200–950)
Basophils Absolute: 40 cells/uL (ref 0–200)
Basophils Relative: 0.8 %
Eosinophils Absolute: 130 cells/uL (ref 15–500)
Eosinophils Relative: 2.6 %
HCT: 47.9 % (ref 38.5–50.0)
Hemoglobin: 16.3 g/dL (ref 13.2–17.1)
Lymphs Abs: 1405 cells/uL (ref 850–3900)
MCH: 31.1 pg (ref 27.0–33.0)
MCHC: 34 g/dL (ref 32.0–36.0)
MCV: 91.4 fL (ref 80.0–100.0)
MPV: 9.2 fL (ref 7.5–12.5)
Monocytes Relative: 9.6 %
Neutro Abs: 2945 cells/uL (ref 1500–7800)
Neutrophils Relative %: 58.9 %
Platelets: 305 10*3/uL (ref 140–400)
RBC: 5.24 10*6/uL (ref 4.20–5.80)
RDW: 12.4 % (ref 11.0–15.0)
Total Lymphocyte: 28.1 %
WBC: 5 10*3/uL (ref 3.8–10.8)

## 2020-12-06 LAB — VITAMIN D 25 HYDROXY (VIT D DEFICIENCY, FRACTURES): Vit D, 25-Hydroxy: 44 ng/mL (ref 30–100)

## 2020-12-08 NOTE — Progress Notes (Signed)
Glucose is 102. Rest of CMP WNL.  CBC WNL.  Vitamin D is WNL-44.  Ok to continue on replesta 50,000 units once monthly.

## 2021-01-28 ENCOUNTER — Other Ambulatory Visit (HOSPITAL_COMMUNITY): Payer: Self-pay

## 2021-01-28 MED ORDER — PAXLOVID (300/100) 20 X 150 MG & 10 X 100MG PO TBPK
ORAL_TABLET | ORAL | 0 refills | Status: DC
  Filled 2021-01-28: qty 30, 5d supply, fill #0

## 2021-02-13 ENCOUNTER — Other Ambulatory Visit: Payer: Self-pay | Admitting: Physician Assistant

## 2021-02-13 DIAGNOSIS — M352 Behcet's disease: Secondary | ICD-10-CM

## 2021-02-13 NOTE — Telephone Encounter (Signed)
Next Visit: Due December 2022  Last Visit: 12/05/2020  Last Fill: 12/05/2020  DX: Behcet's disease   Current Dose per office note 12/05/2020: Mitigare 0.6 mg 1 capsule by mouth daily  Labs: 12/05/2020 Glucose is 102. Rest of CMP WNL.  CBC WNL  Okay to refill Colchicine?

## 2021-03-23 ENCOUNTER — Other Ambulatory Visit: Payer: Self-pay | Admitting: Physician Assistant

## 2021-03-23 DIAGNOSIS — M352 Behcet's disease: Secondary | ICD-10-CM

## 2021-03-23 DIAGNOSIS — Z79899 Other long term (current) drug therapy: Secondary | ICD-10-CM

## 2021-03-23 DIAGNOSIS — R519 Headache, unspecified: Secondary | ICD-10-CM

## 2021-03-24 LAB — COMPLETE METABOLIC PANEL WITH GFR
AG Ratio: 1.8 (calc) (ref 1.0–2.5)
ALT: 34 U/L (ref 9–46)
AST: 24 U/L (ref 10–40)
Albumin: 4.6 g/dL (ref 3.6–5.1)
Alkaline phosphatase (APISO): 57 U/L (ref 36–130)
BUN: 12 mg/dL (ref 7–25)
CO2: 28 mmol/L (ref 20–32)
Calcium: 9.4 mg/dL (ref 8.6–10.3)
Chloride: 103 mmol/L (ref 98–110)
Creat: 0.98 mg/dL (ref 0.60–1.26)
Globulin: 2.5 g/dL (calc) (ref 1.9–3.7)
Glucose, Bld: 94 mg/dL (ref 65–99)
Potassium: 4.3 mmol/L (ref 3.5–5.3)
Sodium: 139 mmol/L (ref 135–146)
Total Bilirubin: 0.5 mg/dL (ref 0.2–1.2)
Total Protein: 7.1 g/dL (ref 6.1–8.1)
eGFR: 106 mL/min/{1.73_m2} (ref >=60)

## 2021-03-24 LAB — CBC WITH DIFFERENTIAL/PLATELET
Absolute Monocytes: 497 cells/uL (ref 200–950)
Basophils Absolute: 59 cells/uL (ref 0–200)
Basophils Relative: 1.1 %
Eosinophils Absolute: 130 cells/uL (ref 15–500)
Eosinophils Relative: 2.4 %
HCT: 48.2 % (ref 38.5–50.0)
Hemoglobin: 16.4 g/dL (ref 13.2–17.1)
Lymphs Abs: 1490 cells/uL (ref 850–3900)
MCH: 30.8 pg (ref 27.0–33.0)
MCHC: 34 g/dL (ref 32.0–36.0)
MCV: 90.6 fL (ref 80.0–100.0)
MPV: 9.3 fL (ref 7.5–12.5)
Monocytes Relative: 9.2 %
Neutro Abs: 3224 cells/uL (ref 1500–7800)
Neutrophils Relative %: 59.7 %
Platelets: 316 10*3/uL (ref 140–400)
RBC: 5.32 10*6/uL (ref 4.20–5.80)
RDW: 12.6 % (ref 11.0–15.0)
Total Lymphocyte: 27.6 %
WBC: 5.4 10*3/uL (ref 3.8–10.8)

## 2021-03-24 LAB — SEDIMENTATION RATE: Sed Rate: 2 mm/h (ref 0–15)

## 2021-03-24 NOTE — Progress Notes (Signed)
CBC and CMP WNL. ESR WNL.

## 2021-05-21 ENCOUNTER — Other Ambulatory Visit: Payer: Self-pay | Admitting: *Deleted

## 2021-05-21 ENCOUNTER — Telehealth: Payer: Self-pay | Admitting: *Deleted

## 2021-05-21 DIAGNOSIS — M352 Behcet's disease: Secondary | ICD-10-CM

## 2021-05-21 MED ORDER — COLCHICINE 0.6 MG PO CAPS: 1.0000 | ORAL_CAPSULE | Freq: Every day | ORAL | 0 refills | Status: DC

## 2021-05-21 NOTE — Telephone Encounter (Signed)
Medication Samples have been provided to the patient.  Drug name: Mitigare       Strength: 0.6 mg        Qty: 2  LOT: [id number] Exp.Date: 03/30/2022  Dosing instructions: TAKE 1 CAPSULE BY MOUTH DAILY

## 2021-05-21 NOTE — Telephone Encounter (Signed)
Patient requesting refill on Colchicine.  Next Visit: 06/08/2021  Last Visit: 12/05/2020  Last Fill: 02/13/2021  DX:  Behcet's disease   Current Dose per office note 12/05/2020: Mitigare 0.6 mg 1 capsule by mouth daily  Labs: 03/23/2021 CBC and CMP WNL.  ESR WNL.   Okay to refill Colchicine?

## 2021-05-28 NOTE — Progress Notes (Signed)
Office Visit Note  Patient: Cristian Weiss             Date of Birth: 11/15/1990           MRN: [id number]             PCP: Karlene Einstein, MD Referring: Karlene Einstein, MD Visit Date: 06/08/2021 Occupation: @GUAROCC @  Subjective:  Left shoulder pain   History of Present Illness: Yahmir Sokolov is a 30 y.o. male with history of behcet's and osteoarthritis.  He is taking imuran 50 mg 3 tablets by mouth daily and colchicine 0.6 mg 1 capsule daily.  He is tolerating these medications without any side effects.  He denies any signs or symptoms of a flare.  He has not had any recent rashes, skin nodules, oral or genital ulcers, or inflammation in his eyes.  He presents today with increased pain in the left shoulder which started 4 weeks ago.  He states he woke up in the middle of the night with the pain.  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  He denies any injury prior to the onset of symptoms.  He describes the pain as a stabbing sensation, which is worsened by exposure to cold temperatures.  He has been having to take 4-5 tylenols per day for pain relief.  He has also tried icy hot which provided temporary relief.    He continues to have some right sided lower back pain but denies any symptoms of radiculopathy.  He denies any other joint pain or joint swelling at this time.     He denies any recent infections.   Activities of Daily Living:  Patient reports morning stiffness for 0 minutes.   Patient Denies nocturnal pain.  Difficulty dressing/grooming: Denies Difficulty climbing stairs: Denies Difficulty getting out of chair: Denies Difficulty using hands for taps, buttons, cutlery, and/or writing: Denies  Review of Systems  Constitutional:  Negative for fatigue and night sweats.  HENT:  Negative for mouth sores, mouth dryness and nose dryness.   Eyes:  Negative for redness and dryness.  Respiratory:  Negative  for shortness of breath and difficulty breathing.   Cardiovascular:  Negative for chest pain, palpitations, hypertension, irregular heartbeat and swelling in legs/feet.  Gastrointestinal:  Negative for constipation and diarrhea.  Endocrine: Negative for increased urination.  Genitourinary:  Negative for painful urination.  Musculoskeletal:  Positive for joint pain and joint pain. Negative for joint swelling, myalgias, muscle weakness, morning stiffness, muscle tenderness and myalgias.  Skin:  Negative for color change, rash, hair loss, nodules/bumps, skin tightness, ulcers and sensitivity to sunlight.  Allergic/Immunologic: Negative for susceptible to infections.  Neurological:  Negative for dizziness, fainting, memory loss, night sweats and weakness.  Hematological:  Negative for swollen glands.  Psychiatric/Behavioral:  Negative for depressed mood and sleep disturbance. The patient is not nervous/anxious.    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   Primary osteoarthritis of both knees 08/15/2018   Lumbar facet arthropathy 08/15/2018   Vitamin D deficiency 07/17/2018   Long term (current) use of systemic steroids 07/17/2018   Chronic right shoulder pain 07/17/2018   Chronic midline low back pain without sciatica 07/17/2018   Oral ulceration 07/08/2018   Pharyngitis    Fever 07/07/2018   Maculopapular rash, generalized 07/07/2018   SIRS (systemic inflammatory response syndrome) (  [place]) 07/07/2018    Past Medical History:  Diagnosis Date   ADHD (attention deficit hyperactivity disorder)    Behcet's disease ([place])    Dyslexia     Family History  Problem Relation Age of Onset   Diabetes Mellitus II Father    Thyroid disease Father    Asthma Father    Hypertension Father    Healthy Son    Past Surgical History:  Procedure Laterality Date   HAND SURGERY Right    SKIN BIOPSY  2020   Social History   Social History  Narrative   Not on file   Immunization History  Administered Date(s) Administered   Hepatitis B 02/24/2011, 03/29/2011   Influenza-Unspecified 03/04/2018   Moderna Sars-Covid-2 Vaccination 01/21/2020, 02/18/2020   Td 12/28/2015     Objective: Vital Signs: BP (!) 142/89 (BP Location: Left Arm, Patient Position: Sitting, Cuff Size: Normal)    Pulse 74    Ht 6' 3"  (1.905 m)    Wt 211 lb 9.6 oz (96 kg)    BMI 26.45 kg/m    Physical Exam Vitals and nursing note reviewed.  Constitutional:      Appearance: He is well-developed.  HENT:     Head: Normocephalic and atraumatic.  Eyes:     Conjunctiva/sclera: Conjunctivae normal.     Pupils: Pupils are equal, round, and reactive to light.  Cardiovascular:     Rate and Rhythm: Normal rate and regular rhythm.     Heart sounds: Normal heart sounds.  Pulmonary:     Effort: Pulmonary effort is normal.     Breath sounds: Normal breath sounds.  Abdominal:     Palpations: Abdomen is soft.  Musculoskeletal:     Cervical back: Normal range of motion and neck supple.  Skin:    General: Skin is warm and dry.     Capillary Refill: Capillary refill takes less than 2 seconds.  Neurological:     Mental Status: He is alert and oriented to person, place, and time.  Psychiatric:        Behavior: Behavior normal.     Musculoskeletal Exam: C-spine, thoracic spine, and lumbar spine good ROM. Shoulder joints have good ROM with some discomfort in the left shoulder.  Tenderness over the subacromial bursa. Elbow joints, wrist joints, MCPs, PIPs, and DIPs good ROM with no synovitis.  Complete fist formation bilaterally.  Hip joints, knee joints, and ankle joints have good ROM with no discomfort.  No warmth or effusion of knee joints.  No tenderness or swelling of ankle joints.   CDAI Exam: CDAI Score: -- Patient Global: --; Provider Global: -- Swollen: --; Tender: -- Joint Exam 06/08/2021   No joint exam has been documented for this visit   There is  currently no information documented on the homunculus. Go to the Rheumatology activity and complete the homunculus joint exam.  Investigation: No additional findings.  Imaging: No results found.  Recent Labs: Lab Results  Component Value Date   WBC 5.4 03/23/2021   HGB 16.4 03/23/2021   PLT 316 03/23/2021   NA 139 03/23/2021   K 4.3 03/23/2021   CL 103 03/23/2021   CO2 28 03/23/2021   GLUCOSE 94 03/23/2021   BUN 12 03/23/2021   CREATININE 0.98 03/23/2021   BILITOT 0.5 03/23/2021   ALKPHOS 47 07/07/2018   AST 24 03/23/2021   ALT 34 03/23/2021   PROT 7.1 03/23/2021   ALBUMIN 4.0 07/07/2018   CALCIUM 9.4 03/23/2021   GFRAA 118 12/05/2020  QFTBGOLDPLUS NEGATIVE 07/11/2018    Speciality Comments: No specialty comments available.  Procedures:  No procedures performed Allergies: Amoxicillin and Penicillins   Assessment / Plan:     Visit Diagnoses: Behcet's disease ([place]) - History of oral ulcers, genital ulcers, rash, arthritis, fever, myalgias, eye inflammation. Prednisone responsive: He has not had any signs or symptoms of a flare.  ESR was 2 on 03/23/2021.  He is clinically doing well on Imuran 50 mg 3 tablets by mouth daily and colchicine 0.6 mg 1 capsule by mouth daily.  He has not had any recurrence of symptoms.  No oral or genital ulcerations.  He has not had any recent rashes or EN lesions.  He has not had any eye pain, redness, or photophobia.   He has experienced intermittent arthralgias and joint stiffness especially with cooler weather temperatures.  He has no synovitis on examination today.  He presents today with increased discomfort in the left shoulder joint consistent with subacromial bursitis.  X-rays of the left shoulder were updated today.  He will notify us if he would like to return for a left shoulder cortisone injection in the future.  He was given a handout of shoulder exercises to perform.  He will remain on the current treatment regimen.  He was advised to  notify us if he develops increased joint pain or joint swelling.  He will follow-up in the office in 5 months.  High risk medication use - Imuran 50 mg 3 tablets by mouth daily and Mitigare 0.6 mg 1 capsule by mouth daily.  CBC and CMP within normal limits on 03/23/2021.  CBC and CMP will be updated today.  His next lab work will be due in April and every 3 months to monitor for drug toxicity.- Plan: CBC with Differential/Platelet, COMPLETE METABOLIC PANEL WITH GFR He has not had any recent infections.  Acute pain of left shoulder -He presents today with left shoulder joint pain which started 4 weeks ago.  No injury or fall prior to the onset of symptoms.  He woke up in the night with discomfort in his left shoulder and has had intermittent discomfort since then.  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  He has been having to take Tylenol 4 to 5 tablets daily for pain relief.  He has tried icy hot which provides temporary relief.  He has good ROM of the left shoulder joint on examination today with some discomfort.  Tenderness over the left subacromial bursa noted.  X-rays of the left shoulder were obtained today.  He was advised to notify us if he would like to return for a cortisone injection in the future.  He was given a handout of shoulder exercises to perform.  Plan: XR Shoulder Left  Primary osteoarthritis of both knees: He has good ROM of both knee joints with no discomfort on examination today.  No warmth or effusion of knee joints noted.  He experiences some stiffness in both knees after sitting for prolonged periods of time.   Lumbar facet arthropathy: He experiences intermittent right sided lower back pain, which exacerbated by sitting for a prolonged period of time while driving for work.   Muscle cramps: Improved   Vitamin D deficiency: He takes replesta 50,000 units once monthly.   Maculopapular rash, generalized:  Resolved.    Orders: Orders Placed This Encounter  Procedures   XR Shoulder Left  CBC with Differential/Platelet   COMPLETE METABOLIC PANEL WITH GFR   No orders of the defined types were placed in this encounter.     Follow-Up Instructions: Return in about 5 months (around 11/06/2021) for Behcet's syndrome, Osteoarthritis.   Ofilia Neas, PA-C  Note - This record has been created using Dragon software.  Chart creation errors have been sought, but may not always  have been located. Such creation errors do not reflect on  the standard of medical care.

## 2021-06-08 ENCOUNTER — Encounter: Payer: Self-pay | Admitting: Physician Assistant

## 2021-06-08 ENCOUNTER — Ambulatory Visit: Payer: Self-pay

## 2021-06-08 ENCOUNTER — Other Ambulatory Visit: Payer: Self-pay

## 2021-06-08 ENCOUNTER — Ambulatory Visit: Payer: 59 | Admitting: Physician Assistant

## 2021-06-08 VITALS — BP 142/89 | HR 74 | Ht 75.0 in | Wt 211.6 lb

## 2021-06-08 DIAGNOSIS — E559 Vitamin D deficiency, unspecified: Secondary | ICD-10-CM

## 2021-06-08 DIAGNOSIS — M352 Behcet's disease: Secondary | ICD-10-CM | POA: Diagnosis not present

## 2021-06-08 DIAGNOSIS — M170 Bilateral primary osteoarthritis of knee: Secondary | ICD-10-CM | POA: Diagnosis not present

## 2021-06-08 DIAGNOSIS — M25512 Pain in left shoulder: Secondary | ICD-10-CM | POA: Diagnosis not present

## 2021-06-08 DIAGNOSIS — R21 Rash and other nonspecific skin eruption: Secondary | ICD-10-CM

## 2021-06-08 DIAGNOSIS — Z79899 Other long term (current) drug therapy: Secondary | ICD-10-CM

## 2021-06-08 DIAGNOSIS — M47816 Spondylosis without myelopathy or radiculopathy, lumbar region: Secondary | ICD-10-CM | POA: Diagnosis not present

## 2021-06-08 DIAGNOSIS — R252 Cramp and spasm: Secondary | ICD-10-CM

## 2021-06-08 NOTE — Progress Notes (Signed)
X-rays of the left shoulder are unremarkable.   Patient will notify us if he would like tor return for a cortisone injection in the future.

## 2021-06-08 NOTE — Patient Instructions (Signed)
Shoulder Exercises Ask your health care provider which exercises are safe for you. Do exercises exactly as told by your health care provider and adjust them as directed. It is normal to feel mild stretching, pulling, tightness, or discomfort as you do these exercises. Stop right away if you feel sudden pain or your pain gets worse. Do not begin these exercises until told by your health care provider. Stretching exercises External rotation and abduction This exercise is sometimes called corner stretch. This exercise rotates your arm outward (external rotation) and moves your arm out from your body (abduction). Stand in a doorway with one of your feet slightly in front of the other. This is called a staggered stance. If you cannot reach your forearms to the door frame, stand facing a corner of a room. Choose one of the following positions as told by your health care provider: Place your hands and forearms on the door frame above your head. Place your hands and forearms on the door frame at the height of your head. Place your hands on the door frame at the height of your elbows. Slowly move your weight onto your front foot until you feel a stretch across your chest and in the front of your shoulders. Keep your head and chest upright and keep your abdominal muscles tight. Hold for __________ seconds. To release the stretch, shift your weight to your back foot. Repeat __________ times. Complete this exercise __________ times a day. Extension, standing Stand and hold a broomstick, a cane, or a similar object behind your back. Your hands should be a little wider than shoulder width apart. Your palms should face away from your back. Keeping your elbows straight and your shoulder muscles relaxed, move the stick away from your body until you feel a stretch in your shoulders (extension). Avoid shrugging your shoulders while you move the stick. Keep your shoulder blades tucked down toward the middle of your  back. Hold for __________ seconds. Slowly return to the starting position. Repeat __________ times. Complete this exercise __________ times a day. Range-of-motion exercises Pendulum  Stand near a wall or a surface that you can hold onto for balance. Bend at the waist and let your left / right arm hang straight down. Use your other arm to support you. Keep your back straight and do not lock your knees. Relax your left / right arm and shoulder muscles, and move your hips and your trunk so your left / right arm swings freely. Your arm should swing because of the motion of your body, not because you are using your arm or shoulder muscles. Keep moving your hips and trunk so your arm swings in the following directions, as told by your health care provider: Side to side. Forward and backward. In clockwise and counterclockwise circles. Continue each motion for __________ seconds, or for as long as told by your health care provider. Slowly return to the starting position. Repeat __________ times. Complete this exercise __________ times a day. Shoulder flexion, standing  Stand and hold a broomstick, a cane, or a similar object. Place your hands a little more than shoulder width apart on the object. Your left / right hand should be palm up, and your other hand should be palm down. Keep your elbow straight and your shoulder muscles relaxed. Push the stick up with your healthy arm to raise your left / right arm in front of your body, and then over your head until you feel a stretch in your shoulder (flexion). Avoid   shrugging your shoulder while you raise your arm. Keep your shoulder blade tucked down toward the middle of your back. Hold for __________ seconds. Slowly return to the starting position. Repeat __________ times. Complete this exercise __________ times a day. Shoulder abduction, standing Stand and hold a broomstick, a cane, or a similar object. Place your hands a little more than shoulder  width apart on the object. Your left / right hand should be palm up, and your other hand should be palm down. Keep your elbow straight and your shoulder muscles relaxed. Push the object across your body toward your left / right side. Raise your left / right arm to the side of your body (abduction) until you feel a stretch in your shoulder. Do not raise your arm above shoulder height unless your health care provider tells you to do that. If directed, raise your arm over your head. Avoid shrugging your shoulder while you raise your arm. Keep your shoulder blade tucked down toward the middle of your back. Hold for __________ seconds. Slowly return to the starting position. Repeat __________ times. Complete this exercise __________ times a day. Internal rotation  Place your left / right hand behind your back, palm up. Use your other hand to dangle an exercise band, a towel, or a similar object over your shoulder. Grasp the band with your left / right hand so you are holding on to both ends. Gently pull up on the band until you feel a stretch in the front of your left / right shoulder. The movement of your arm toward the center of your body is called internal rotation. Avoid shrugging your shoulder while you raise your arm. Keep your shoulder blade tucked down toward the middle of your back. Hold for __________ seconds. Release the stretch by letting go of the band and lowering your hands. Repeat __________ times. Complete this exercise __________ times a day. Strengthening exercises External rotation  Sit in a stable chair without armrests. Secure an exercise band to a stable object at elbow height on your left / right side. Place a soft object, such as a folded towel or a small pillow, between your left / right upper arm and your body to move your elbow about 4 inches (10 cm) away from your side. Hold the end of the exercise band so it is tight and there is no slack. Keeping your elbow pressed  against the soft object, slowly move your forearm out, away from your abdomen (external rotation). Keep your body steady so only your forearm moves. Hold for __________ seconds. Slowly return to the starting position. Repeat __________ times. Complete this exercise __________ times a day. Shoulder abduction  Sit in a stable chair without armrests, or stand up. Hold a __________ weight in your left / right hand, or hold an exercise band with both hands. Start with your arms straight down and your left / right palm facing in, toward your body. Slowly lift your left / right hand out to your side (abduction). Do not lift your hand above shoulder height unless your health care provider tells you that this is safe. Keep your arms straight. Avoid shrugging your shoulder while you do this movement. Keep your shoulder blade tucked down toward the middle of your back. Hold for __________ seconds. Slowly lower your arm, and return to the starting position. Repeat __________ times. Complete this exercise __________ times a day. Shoulder extension Sit in a stable chair without armrests, or stand up. Secure an exercise band   to a stable object in front of you so it is at shoulder height. Hold one end of the exercise band in each hand. Your palms should face each other. Straighten your elbows and lift your hands up to shoulder height. Step back, away from the secured end of the exercise band, until the band is tight and there is no slack. Squeeze your shoulder blades together as you pull your hands down to the sides of your thighs (extension). Stop when your hands are straight down by your sides. Do not let your hands go behind your body. Hold for __________ seconds. Slowly return to the starting position. Repeat __________ times. Complete this exercise __________ times a day. Shoulder row Sit in a stable chair without armrests, or stand up. Secure an exercise band to a stable object in front of you so it  is at waist height. Hold one end of the exercise band in each hand. Position your palms so that your thumbs are facing the ceiling (neutral position). Bend each of your elbows to a 90-degree angle (right angle) and keep your upper arms at your sides. Step back until the band is tight and there is no slack. Slowly pull your elbows back behind you. Hold for __________ seconds. Slowly return to the starting position. Repeat __________ times. Complete this exercise __________ times a day. Shoulder press-ups  Sit in a stable chair that has armrests. Sit upright, with your feet flat on the floor. Put your hands on the armrests so your elbows are bent and your fingers are pointing forward. Your hands should be about even with the sides of your body. Push down on the armrests and use your arms to lift yourself off the chair. Straighten your elbows and lift yourself up as much as you comfortably can. Move your shoulder blades down, and avoid letting your shoulders move up toward your ears. Keep your feet on the ground. As you get stronger, your feet should support less of your body weight as you lift yourself up. Hold for __________ seconds. Slowly lower yourself back into the chair. Repeat __________ times. Complete this exercise __________ times a day. Wall push-ups  Stand so you are facing a stable wall. Your feet should be about one arm-length away from the wall. Lean forward and place your palms on the wall at shoulder height. Keep your feet flat on the floor as you bend your elbows and lean forward toward the wall. Hold for __________ seconds. Straighten your elbows to push yourself back to the starting position. Repeat __________ times. Complete this exercise __________ times a day. This information is not intended to replace advice given to you by your health care provider. Make sure you discuss any questions you have with your healthcare provider. Document Revised: 09/08/2018 Document  Reviewed: 06/16/2018 Elsevier Patient Education  2022 [employer]  

## 2021-06-09 LAB — COMPLETE METABOLIC PANEL WITH GFR
AG Ratio: 1.5 (calc) (ref 1.0–2.5)
ALT: 19 U/L (ref 9–46)
AST: 17 U/L (ref 10–40)
Albumin: 4.5 g/dL (ref 3.6–5.1)
Alkaline phosphatase (APISO): 54 U/L (ref 36–130)
BUN: 12 mg/dL (ref 7–25)
CO2: 29 mmol/L (ref 20–32)
Calcium: 9.8 mg/dL (ref 8.6–10.3)
Chloride: 104 mmol/L (ref 98–110)
Creat: 1 mg/dL (ref 0.60–1.26)
Globulin: 3 g/dL (calc) (ref 1.9–3.7)
Glucose, Bld: 76 mg/dL (ref 65–99)
Potassium: 4.8 mmol/L (ref 3.5–5.3)
Sodium: 140 mmol/L (ref 135–146)
Total Bilirubin: 0.4 mg/dL (ref 0.2–1.2)
Total Protein: 7.5 g/dL (ref 6.1–8.1)
eGFR: 104 mL/min/{1.73_m2} (ref >=60)

## 2021-06-09 LAB — CBC WITH DIFFERENTIAL/PLATELET
Absolute Monocytes: 577 cells/uL (ref 200–950)
Basophils Absolute: 62 cells/uL (ref 0–200)
Basophils Relative: 1.1 %
Eosinophils Absolute: 196 cells/uL (ref 15–500)
Eosinophils Relative: 3.5 %
HCT: 48 % (ref 38.5–50.0)
Hemoglobin: 16.5 g/dL (ref 13.2–17.1)
Lymphs Abs: 1579 cells/uL (ref 850–3900)
MCH: 30.9 pg (ref 27.0–33.0)
MCHC: 34.4 g/dL (ref 32.0–36.0)
MCV: 89.9 fL (ref 80.0–100.0)
MPV: 9.3 fL (ref 7.5–12.5)
Monocytes Relative: 10.3 %
Neutro Abs: 3186 cells/uL (ref 1500–7800)
Neutrophils Relative %: 56.9 %
Platelets: 358 10*3/uL (ref 140–400)
RBC: 5.34 10*6/uL (ref 4.20–5.80)
RDW: 12.3 % (ref 11.0–15.0)
Total Lymphocyte: 28.2 %
WBC: 5.6 10*3/uL (ref 3.8–10.8)

## 2021-06-09 NOTE — Progress Notes (Signed)
CBC and CMP WNL

## 2021-06-11 ENCOUNTER — Other Ambulatory Visit: Payer: Self-pay | Admitting: Physician Assistant

## 2021-06-11 DIAGNOSIS — M352 Behcet's disease: Secondary | ICD-10-CM

## 2021-06-11 NOTE — Telephone Encounter (Signed)
Next Visit: 11/23/2021  Last Visit: 06/08/2021  Last Fill: 12/05/2020  DX: Behcet's disease   Current Dose per office note 06/08/2021: Imuran 50 mg 3 tablets by mouth daily   Labs: 06/08/2021 CBC and CMP WNL  Okay to refill Imuran?

## 2021-07-28 ENCOUNTER — Other Ambulatory Visit: Payer: Self-pay | Admitting: Rheumatology

## 2021-07-28 DIAGNOSIS — M352 Behcet's disease: Secondary | ICD-10-CM

## 2021-11-13 NOTE — Progress Notes (Unsigned)
Office Visit Note  Patient: Cristian Weiss             Date of Birth: 1991-05-06           MRN: [id number]             PCP: Jolene Provost, MD Referring: Jolene Provost, MD Visit Date: 11/23/2021 Occupation: @GUAROCC @  Subjective:  No chief complaint on file.   History of Present Illness: Cristian Weiss is a 31 y.o. male ***   Activities of Daily Living:  Patient reports morning stiffness for *** {minute/hour:19697}.   Patient {ACTIONS;DENIES/REPORTS:21021675::"Denies"} nocturnal pain.  Difficulty dressing/grooming: {ACTIONS;DENIES/REPORTS:21021675::"Denies"} Difficulty climbing stairs: {ACTIONS;DENIES/REPORTS:21021675::"Denies"} Difficulty getting out of chair: {ACTIONS;DENIES/REPORTS:21021675::"Denies"} Difficulty using hands for taps, buttons, cutlery, and/or writing: {ACTIONS;DENIES/REPORTS:21021675::"Denies"}  No Rheumatology ROS completed.   PMFS History:  Patient Active Problem List   Diagnosis Date Noted  . 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 Primary osteoarthritis of both knees 08/15/2018  . Lumbar facet arthropathy 08/15/2018  . Vitamin D deficiency 07/17/2018  . Long term (current) use of systemic steroids 07/17/2018  . Chronic right shoulder pain 07/17/2018  . Chronic midline low back pain without sciatica 07/17/2018  . Oral ulceration 07/08/2018  . Pharyngitis   . Fever 07/07/2018  . Maculopapular rash, generalized 07/07/2018  . SIRS (systemic inflammatory response syndrome) (HCC) 07/07/2018    Past Medical History:  Diagnosis Date  . ADHD (attention deficit hyperactivity disorder)   . Behcet's disease (HCC)   . Dyslexia     Family History  Problem Relation Age of Onset  . Diabetes Mellitus II Father   . Thyroid disease Father   . Asthma Father   . Hypertension Father   . Healthy Son    Past Surgical History:  Procedure Laterality Date  . HAND SURGERY Right   . SKIN BIOPSY  2020   Social History   Social History Narrative  . Not on  file   Immunization History  Administered Date(s) Administered  . Hepatitis B 02/24/2011, 03/29/2011  . Influenza-Unspecified 03/04/2018  . Moderna Sars-Covid-2 Vaccination 01/21/2020, 02/18/2020  . Td 12/28/2015     Objective: Vital Signs: There were no vitals taken for this visit.   Physical Exam   Musculoskeletal Exam: ***  CDAI Exam: CDAI Score: -- Patient Global: --; Provider Global: -- Swollen: --; Tender: -- Joint Exam 11/23/2021   No joint exam has been documented for this visit   There is currently no information documented on the homunculus. Go to the Rheumatology activity and complete the homunculus joint exam.  Investigation: No additional findings.  Imaging: No results found.  Recent Labs: Lab Results  Component Value Date   WBC 5.6 06/08/2021   HGB 16.5 06/08/2021   PLT 358 06/08/2021   NA 140 06/08/2021   K 4.8 06/08/2021   CL 104 06/08/2021   CO2 29 06/08/2021   GLUCOSE 76 06/08/2021   BUN 12 06/08/2021   CREATININE 1.00 06/08/2021   BILITOT 0.4 06/08/2021   ALKPHOS 47 07/07/2018   AST 17 06/08/2021   ALT 19 06/08/2021   PROT 7.5 06/08/2021   ALBUMIN 4.0 07/07/2018   CALCIUM 9.8 06/08/2021   GFRAA 118 12/05/2020   QFTBGOLDPLUS NEGATIVE 07/11/2018    Speciality Comments: No specialty comments available.  Procedures:  No procedures performed Allergies: Amoxicillin and Penicillins   Assessment / Plan:     Visit Diagnoses: Behcet's disease (HCC)  High risk medication use  Primary osteoarthritis of both knees  Lumbar facet arthropathy  Muscle  cramps  Vitamin D deficiency  Acute pain of left shoulder  Orders: No orders of the defined types were placed in this encounter.  No orders of the defined types were placed in this encounter.   Face-to-face time spent with patient was *** minutes. Greater than 50% of time was spent in counseling and coordination of care.  Follow-Up Instructions: No follow-ups on file.   Gearldine Bienenstock, PA-C  Note - This record has been created using Dragon software.  Chart creation errors have been sought, but may not always  have been located. Such creation errors do not reflect on  the standard of medical care.

## 2021-11-15 ENCOUNTER — Other Ambulatory Visit: Payer: Self-pay | Admitting: Rheumatology

## 2021-11-15 DIAGNOSIS — M352 Behcet's disease: Secondary | ICD-10-CM

## 2021-11-16 NOTE — Telephone Encounter (Signed)
Next Visit: 11/23/2021  Last Visit: 06/08/2021  Last Fill: 05/21/2021  DX: Behcet's disease   Current Dose per office note 06/08/2021: Mitigare 0.6 mg 1 capsule by mouth daily  Labs: 06/08/2021, CBC and CMP WNL  Okay to refill mitigare?

## 2021-11-23 ENCOUNTER — Ambulatory Visit (INDEPENDENT_AMBULATORY_CARE_PROVIDER_SITE_OTHER): Payer: 59 | Admitting: Rheumatology

## 2021-11-23 ENCOUNTER — Encounter: Payer: Self-pay | Admitting: Rheumatology

## 2021-11-23 VITALS — BP 144/78 | HR 88 | Resp 15 | Ht 75.0 in | Wt 208.0 lb

## 2021-11-23 DIAGNOSIS — E559 Vitamin D deficiency, unspecified: Secondary | ICD-10-CM

## 2021-11-23 DIAGNOSIS — M25571 Pain in right ankle and joints of right foot: Secondary | ICD-10-CM

## 2021-11-23 DIAGNOSIS — M25512 Pain in left shoulder: Secondary | ICD-10-CM

## 2021-11-23 DIAGNOSIS — R252 Cramp and spasm: Secondary | ICD-10-CM

## 2021-11-23 DIAGNOSIS — M352 Behcet's disease: Secondary | ICD-10-CM | POA: Diagnosis not present

## 2021-11-23 DIAGNOSIS — Z79899 Other long term (current) drug therapy: Secondary | ICD-10-CM | POA: Diagnosis not present

## 2021-11-23 DIAGNOSIS — M47816 Spondylosis without myelopathy or radiculopathy, lumbar region: Secondary | ICD-10-CM

## 2021-11-23 DIAGNOSIS — M170 Bilateral primary osteoarthritis of knee: Secondary | ICD-10-CM | POA: Diagnosis not present

## 2021-11-24 LAB — COMPLETE METABOLIC PANEL WITH GFR
AG Ratio: 1.6 (calc) (ref 1.0–2.5)
ALT: 25 U/L (ref 9–46)
AST: 19 U/L (ref 10–40)
Albumin: 4.6 g/dL (ref 3.6–5.1)
Alkaline phosphatase (APISO): 68 U/L (ref 36–130)
BUN: 15 mg/dL (ref 7–25)
CO2: 26 mmol/L (ref 20–32)
Calcium: 9.8 mg/dL (ref 8.6–10.3)
Chloride: 103 mmol/L (ref 98–110)
Creat: 0.95 mg/dL (ref 0.60–1.26)
Globulin: 2.9 g/dL (calc) (ref 1.9–3.7)
Glucose, Bld: 72 mg/dL (ref 65–99)
Potassium: 4.3 mmol/L (ref 3.5–5.3)
Sodium: 139 mmol/L (ref 135–146)
Total Bilirubin: 0.5 mg/dL (ref 0.2–1.2)
Total Protein: 7.5 g/dL (ref 6.1–8.1)
eGFR: 110 mL/min/{1.73_m2} (ref >=60)

## 2021-11-24 LAB — CBC WITH DIFFERENTIAL/PLATELET
Absolute Monocytes: 616 cells/uL (ref 200–950)
Basophils Absolute: 61 cells/uL (ref 0–200)
Basophils Relative: 1.1 %
Eosinophils Absolute: 110 cells/uL (ref 15–500)
Eosinophils Relative: 2 %
HCT: 50.5 % — ABNORMAL HIGH (ref 38.5–50.0)
Hemoglobin: 17 g/dL (ref 13.2–17.1)
Lymphs Abs: 1447 cells/uL (ref 850–3900)
MCH: 30.6 pg (ref 27.0–33.0)
MCHC: 33.7 g/dL (ref 32.0–36.0)
MCV: 90.8 fL (ref 80.0–100.0)
MPV: 9.6 fL (ref 7.5–12.5)
Monocytes Relative: 11.2 %
Neutro Abs: 3267 cells/uL (ref 1500–7800)
Neutrophils Relative %: 59.4 %
Platelets: 272 10*3/uL (ref 140–400)
RBC: 5.56 10*6/uL (ref 4.20–5.80)
RDW: 12.8 % (ref 11.0–15.0)
Total Lymphocyte: 26.3 %
WBC: 5.5 10*3/uL (ref 3.8–10.8)

## 2021-11-24 LAB — SEDIMENTATION RATE: Sed Rate: 2 mm/h (ref 0–15)

## 2022-01-14 ENCOUNTER — Other Ambulatory Visit (HOSPITAL_COMMUNITY): Payer: Self-pay

## 2022-01-14 MED ORDER — AZITHROMYCIN 250 MG PO TABS
ORAL_TABLET | ORAL | 0 refills | Status: DC
  Filled 2022-01-14: qty 6, 5d supply, fill #0

## 2022-01-26 ENCOUNTER — Other Ambulatory Visit: Payer: Self-pay | Admitting: Physician Assistant

## 2022-01-26 DIAGNOSIS — M352 Behcet's disease: Secondary | ICD-10-CM

## 2022-01-28 ENCOUNTER — Other Ambulatory Visit: Payer: Self-pay | Admitting: Rheumatology

## 2022-01-28 DIAGNOSIS — M352 Behcet's disease: Secondary | ICD-10-CM

## 2022-01-28 NOTE — Telephone Encounter (Signed)
Please schedule patient a follow up visit. Patient due November 2023. Thanks!  

## 2022-01-28 NOTE — Telephone Encounter (Signed)
Next Visit: Due November 2023. Message sent to the front to schedule  Last Visit: 11/23/2021  Last Fill: 06/11/2021  DX: Behcet's disease   Current Dose per office note 11/23/2021: Imuran 50 mg 3 tablets by mouth daily   Labs: 11/23/2021 CBC, CMP and sed rate are normal.  Okay to refill Imuran?

## 2022-02-16 ENCOUNTER — Telehealth: Payer: Self-pay | Admitting: *Deleted

## 2022-02-16 NOTE — Telephone Encounter (Signed)
Medication Samples have been provided to the patient.  Drug name: Mitigare       Strength: 0.6 mg        Qty: 4  LOT: [id number]  Exp.Date: 03/30/2022  Dosing instructions: Take  1 capsule by mouth daily.

## 2022-04-05 ENCOUNTER — Other Ambulatory Visit: Payer: Self-pay | Admitting: Rheumatology

## 2022-04-05 DIAGNOSIS — M352 Behcet's disease: Secondary | ICD-10-CM

## 2022-04-05 DIAGNOSIS — Z79899 Other long term (current) drug therapy: Secondary | ICD-10-CM

## 2022-04-05 NOTE — Telephone Encounter (Signed)
Next Visit: Due November 2023. Patient aware and will schedule appointment  Last Visit: 11/23/2021  Last Fill: 01/28/2022  DX:  Behcet's disease    Current Dose per office note 11/23/2021:  Imuran 50 mg 3 tablets by mouth daily   Labs: 11/23/2021 CBC, CMP and sed rate are normal.   Patient aware he is due to update labs. Patient will try to update labs this week.   Okay to refill Imuran?

## 2022-04-05 NOTE — Progress Notes (Unsigned)
Office Visit Note  Patient: Cristian Weiss             Date of Birth: [birth year]           MRN: [id number]             PCP: Crista Elliot, PA-C Referring: Crista Elliot, PA-C Visit Date: 04/06/2022 Occupation: @GUAROCC @  Subjective:  Medication monitoring   History of Present Illness: Cristian Weiss is a 31 y.o. male with history of behcet's.  Patient is currently taking imuran 150 mg daily and colchicine 0.6 mg daily. He continues to tolerate both medications without any side effects.  He denies any signs or symptoms of a flare.  He has not had any oral or genital ulcers.  He denies any recent rashes.  He denies any eye pain or photophobia.  He has not noticed any joint swelling or morning stiffness.  He has occasional discomfort in his shoulders which he attributes to the position he sleeps in at night.  He denies any recent or recurrent infections.  He received the annual flu shot in October 2023. He denies any new medical conditions. He continues to take vitamin D 50,000 units once monthly.  Activities of Daily Living:  Patient reports morning stiffness for 0  none .   Patient Denies nocturnal pain.  Difficulty dressing/grooming: Denies Difficulty climbing stairs: Denies Difficulty getting out of chair: Denies Difficulty using hands for taps, buttons, cutlery, and/or writing: Denies  Review of Systems  Constitutional:  Negative for fatigue.  HENT:  Negative for mouth sores and mouth dryness.   Eyes:  Negative for dryness.  Respiratory:  Negative for shortness of breath.   Cardiovascular:  Negative for chest pain and palpitations.  Gastrointestinal:  Negative for blood in stool, constipation and diarrhea.  Endocrine: Negative for increased urination.  Genitourinary:  Negative for involuntary urination.  Musculoskeletal:  Negative for joint pain, gait problem, joint pain, joint swelling, myalgias, muscle weakness, morning stiffness, muscle tenderness and myalgias.   Skin:  Negative for color change, rash, hair loss and sensitivity to sunlight.  Allergic/Immunologic: Negative for susceptible to infections.  Neurological:  Negative for dizziness and headaches.  Hematological:  Negative for swollen glands.  Psychiatric/Behavioral:  Negative for depressed mood and sleep disturbance. The patient is not nervous/anxious.     PMFS History:  Patient Active Problem List   Diagnosis Date Noted   Behcet's disease ([place]) 08/15/2018   Primary osteoarthritis of both knees 08/15/2018   Lumbar facet arthropathy 08/15/2018   Vitamin D deficiency 07/17/2018   Long term (current) use of systemic steroids 07/17/2018   Chronic right shoulder pain 07/17/2018   Chronic midline low back pain without sciatica 07/17/2018   Oral ulceration 07/08/2018   Pharyngitis    Fever 07/07/2018   Maculopapular rash, generalized 07/07/2018   SIRS (systemic inflammatory response syndrome) ([place]) 07/07/2018    Past Medical History:  Diagnosis Date   ADHD (attention deficit hyperactivity disorder)    Behcet's disease ([place])    Dyslexia     Family History  Problem Relation Age of Onset   Diabetes Mellitus II Father    Thyroid disease Father    Asthma Father    Hypertension Father    Healthy Son    Past Surgical History:  Procedure Laterality Date   HAND SURGERY Right    SKIN BIOPSY  2020   Social History   Social History Narrative   Not on file   Immunization History  Administered Date(s) Administered  Hepatitis B 02/24/2011, 03/29/2011   Influenza-Unspecified 03/04/2018   Moderna Sars-Covid-2 Vaccination 01/21/2020, 02/18/2020   Td 12/28/2015     Objective: Vital Signs: BP 125/81 (BP Location: Left Arm, Patient Position: Sitting, Cuff Size: Normal)   Pulse 86   Resp 14   Ht 6\' 3"  (1.905 m)   Wt 208 lb (94.3 kg)   BMI 26.00 kg/m    Physical Exam Vitals and nursing note reviewed.  Constitutional:      Appearance: He is well-developed.  HENT:     Head:  Normocephalic and atraumatic.  Eyes:     Conjunctiva/sclera: Conjunctivae normal.     Pupils: Pupils are equal, round, and reactive to light.  Cardiovascular:     Rate and Rhythm: Normal rate and regular rhythm.     Heart sounds: Normal heart sounds.  Pulmonary:     Effort: Pulmonary effort is normal.     Breath sounds: Normal breath sounds.  Abdominal:     General: Bowel sounds are normal.     Palpations: Abdomen is soft.  Musculoskeletal:     Cervical back: Normal range of motion and neck supple.  Skin:    General: Skin is warm and dry.     Capillary Refill: Capillary refill takes less than 2 seconds.  Neurological:     Mental Status: He is alert and oriented to person, place, and time.  Psychiatric:        Behavior: Behavior normal.      Musculoskeletal Exam: C-spine, thoracic spine, and lumbar spine good ROM. No midline spinal tenderness.  No SI joint tenderness. Shoulder joints, elbow joints, wrist joints, MCPs, PIPs, and DIPs good ROM with no synovitis.  Complete fist formation bilaterally.  Hip joints, knee joints, and ankle joints have good ROM with no discomfort.  No warmth or effusion of knee joints.  No tenderness or swelling of ankle joints.   CDAI Exam: CDAI Score: -- Patient Global: --; Provider Global: -- Swollen: --; Tender: -- Joint Exam 04/06/2022   No joint exam has been documented for this visit   There is currently no information documented on the homunculus. Go to the Rheumatology activity and complete the homunculus joint exam.  Investigation: No additional findings.  Imaging: No results found.  Recent Labs: Lab Results  Component Value Date   WBC 5.5 11/23/2021   HGB 17.0 11/23/2021   PLT 272 11/23/2021   NA 139 11/23/2021   K 4.3 11/23/2021   CL 103 11/23/2021   CO2 26 11/23/2021   GLUCOSE 72 11/23/2021   BUN 15 11/23/2021   CREATININE 0.95 11/23/2021   BILITOT 0.5 11/23/2021   ALKPHOS 47 07/07/2018   AST 19 11/23/2021   ALT 25  11/23/2021   PROT 7.5 11/23/2021   ALBUMIN 4.0 07/07/2018   CALCIUM 9.8 11/23/2021   GFRAA 118 12/05/2020   QFTBGOLDPLUS NEGATIVE 07/11/2018    Speciality Comments: No specialty comments available.  Procedures:  No procedures performed Allergies: Amoxicillin and Penicillins   Assessment / Plan:     Visit Diagnoses: Behcet's disease (HCC) - History of oral ulcers, genital ulcers, rash, arthritis, fever, myalgias, eye inflammation. Prednisone responsive: No recurrence of symptoms while on Imuran 150 mg daily and Mitigare 0.6 mg 1 capsule daily.  No signs of a flare.  He is tolerating combination therapy without any side effects or recent or recurrent infections.  He has not had any oral or genital ulcers.  No recent rashes or recurrence of eye inflammation.  He has  occasional discomfort in his shoulder joints but has good range of motion on examination today.  He has no synovitis on examination.  He will remain on Imuran as prescribed.  He will try spacing Mitigare to 0.6 mg 1 capsule by mouth every other day.  If he develops signs or symptoms of a flare he will resume Mitigare daily dosing. He was advised to notify us if he develops signs or symptoms of a flare.  He will follow-up in the office in 5 months or sooner if needed.  High risk medication use - Imuran 50 mg 3 tablets by mouth daily and Mitigare 0.6 mg 1 capsule by mouth every other day.  CBC and CMP updated on 11/23/21.  Orders for CBC and CMP released today.  His next lab work will be due in February and every 3 months.   He has not had any recent or recurrent infections.  Discussed the importance of holding imuran if he develops signs or symptoms of an infection and to resume once the infection has completley cleared.  He received the annual flu shot in October 2023. - Plan: CBC with Differential/Platelet, COMPLETE METABOLIC PANEL WITH GFR  Primary osteoarthritis of both knees: He has good ROM of both knee joints with no discomfort.   No warmth or effusion of knee joints.   Lumbar facet arthropathy: No midline spinal tenderness.  No symptoms of radiculopathy at this time.   Vitamin D deficiency: He is taking vitamin D 50,000 units once monthly.   Orders: Orders Placed This Encounter  Procedures   CBC with Differential/Platelet   COMPLETE METABOLIC PANEL WITH GFR   No orders of the defined types were placed in this encounter.    Follow-Up Instructions: Return in about 5 months (around 09/05/2022) for Behcet's.   Gearldine Bienenstock, PA-C  Note - This record has been created using Dragon software.  Chart creation errors have been sought, but may not always  have been located. Such creation errors do not reflect on  the standard of medical care.

## 2022-04-06 ENCOUNTER — Encounter: Payer: Self-pay | Admitting: Physician Assistant

## 2022-04-06 ENCOUNTER — Ambulatory Visit: Payer: 59 | Attending: Physician Assistant | Admitting: Physician Assistant

## 2022-04-06 VITALS — BP 125/81 | HR 86 | Resp 14 | Ht 75.0 in | Wt 208.0 lb

## 2022-04-06 DIAGNOSIS — E559 Vitamin D deficiency, unspecified: Secondary | ICD-10-CM

## 2022-04-06 DIAGNOSIS — Z79899 Other long term (current) drug therapy: Secondary | ICD-10-CM | POA: Diagnosis not present

## 2022-04-06 DIAGNOSIS — M352 Behcet's disease: Secondary | ICD-10-CM | POA: Diagnosis not present

## 2022-04-06 DIAGNOSIS — M170 Bilateral primary osteoarthritis of knee: Secondary | ICD-10-CM

## 2022-04-06 DIAGNOSIS — M47816 Spondylosis without myelopathy or radiculopathy, lumbar region: Secondary | ICD-10-CM | POA: Diagnosis not present

## 2022-04-06 LAB — CBC WITH DIFFERENTIAL/PLATELET
Absolute Monocytes: 806 cells/uL (ref 200–950)
Basophils Absolute: 50 cells/uL (ref 0–200)
Basophils Relative: 0.7 %
Eosinophils Absolute: 151 cells/uL (ref 15–500)
Eosinophils Relative: 2.1 %
HCT: 45.5 % (ref 38.5–50.0)
Hemoglobin: 15.9 g/dL (ref 13.2–17.1)
Lymphs Abs: 1901 cells/uL (ref 850–3900)
MCH: 31.2 pg (ref 27.0–33.0)
MCHC: 34.9 g/dL (ref 32.0–36.0)
MCV: 89.4 fL (ref 80.0–100.0)
MPV: 10 fL (ref 7.5–12.5)
Monocytes Relative: 11.2 %
Neutro Abs: 4291 cells/uL (ref 1500–7800)
Neutrophils Relative %: 59.6 %
Platelets: 255 10*3/uL (ref 140–400)
RBC: 5.09 10*6/uL (ref 4.20–5.80)
RDW: 12.5 % (ref 11.0–15.0)
Total Lymphocyte: 26.4 %
WBC: 7.2 10*3/uL (ref 3.8–10.8)

## 2022-04-06 LAB — COMPLETE METABOLIC PANEL WITH GFR
AG Ratio: 1.6 (calc) (ref 1.0–2.5)
ALT: 28 U/L (ref 9–46)
AST: 20 U/L (ref 10–40)
Albumin: 4.5 g/dL (ref 3.6–5.1)
Alkaline phosphatase (APISO): 75 U/L (ref 36–130)
BUN: 15 mg/dL (ref 7–25)
CO2: 31 mmol/L (ref 20–32)
Calcium: 9.3 mg/dL (ref 8.6–10.3)
Chloride: 103 mmol/L (ref 98–110)
Creat: 1.07 mg/dL (ref 0.60–1.26)
Globulin: 2.8 g/dL (calc) (ref 1.9–3.7)
Glucose, Bld: 77 mg/dL (ref 65–99)
Potassium: 4.4 mmol/L (ref 3.5–5.3)
Sodium: 140 mmol/L (ref 135–146)
Total Bilirubin: 0.4 mg/dL (ref 0.2–1.2)
Total Protein: 7.3 g/dL (ref 6.1–8.1)
eGFR: 95 mL/min/{1.73_m2} (ref >=60)

## 2022-04-06 NOTE — Patient Instructions (Signed)
Standing Labs We placed an order today for your standing lab work.   Please have your standing labs drawn in February and every 3 months   Please have your labs drawn 2 weeks prior to your appointment so that the provider can discuss your lab results at your appointment.  Please note that you may see your imaging and lab results in MyChart before we have reviewed them. We will contact you once all results are reviewed. Please allow our office up to 72 hours to thoroughly review all of the results before contacting the office for clarification of your results.  Lab hours are:   Monday through Thursday from 8:00 am -12:30 pm and 1:00 pm-5:00 pm and Friday from 8:00 am-12:00 pm.  Please be advised, all patients with office appointments requiring lab work will take precedent over walk-in lab work.   Labs are drawn by [facility]. Please bring your co-pay at the time of your lab draw.  You may receive a bill from [facility] for your lab work.  Please note if you are on Hydroxychloroquine and and an order has been placed for a Hydroxychloroquine level, you will need to have it drawn 4 hours or more after your last dose.  If you wish to have your labs drawn at another location, please call the office 24 hours in advance so we can fax the orders.  The office is located at [address], [address], [place], [place] [address] No appointment is necessary.    If you have any questions regarding directions or hours of operation,  please call [phone].   As a reminder, please drink plenty of water prior to coming for your lab work. Thanks!  

## 2022-04-07 ENCOUNTER — Other Ambulatory Visit (HOSPITAL_COMMUNITY): Payer: Self-pay

## 2022-04-07 ENCOUNTER — Other Ambulatory Visit: Payer: Self-pay | Admitting: *Deleted

## 2022-04-07 DIAGNOSIS — M352 Behcet's disease: Secondary | ICD-10-CM

## 2022-04-07 MED ORDER — COLCHICINE 0.6 MG PO CAPS
1.0000 | ORAL_CAPSULE | ORAL | 0 refills | Status: DC
  Filled 2022-04-07: qty 45, 90d supply, fill #0

## 2022-04-07 NOTE — Progress Notes (Signed)
CBC and CMP WNL

## 2022-04-07 NOTE — Addendum Note (Signed)
Addended by: Audrie Lia on: 04/07/2022 04:27 PM   Modules accepted: Orders

## 2022-04-08 ENCOUNTER — Other Ambulatory Visit (HOSPITAL_COMMUNITY): Payer: Self-pay

## 2022-04-08 ENCOUNTER — Other Ambulatory Visit: Payer: Self-pay | Admitting: Physician Assistant

## 2022-04-08 DIAGNOSIS — M352 Behcet's disease: Secondary | ICD-10-CM

## 2022-04-09 ENCOUNTER — Other Ambulatory Visit (HOSPITAL_COMMUNITY): Payer: Self-pay

## 2022-04-09 MED ORDER — COLCHICINE 0.6 MG PO CAPS
0.6000 mg | ORAL_CAPSULE | ORAL | 0 refills | Status: DC
  Filled 2022-04-09: qty 45, 90d supply, fill #0

## 2022-04-09 NOTE — Telephone Encounter (Signed)
Next Visit: Patient's wife will schedule appt  Last Visit: 04/06/2022  Last Fill: 11/16/2021  DX:  Behcet's disease   Current Dose per office note 04/06/2022: Mitigare 0.6 mg 1 capsule by mouth every other day.    Labs: 11/7//2023 CBC and CMP WNL   Okay to refill Mitigare?

## 2022-04-12 ENCOUNTER — Other Ambulatory Visit: Payer: Self-pay | Admitting: *Deleted

## 2022-04-12 DIAGNOSIS — M352 Behcet's disease: Secondary | ICD-10-CM

## 2022-04-12 MED ORDER — COLCHICINE 0.6 MG PO CAPS: 0.6000 mg | ORAL_CAPSULE | ORAL | 0 refills | Status: DC

## 2022-04-12 NOTE — Telephone Encounter (Signed)
RX sent as caps to [facility] per patient's wife, Marissa's request.

## 2022-06-20 ENCOUNTER — Other Ambulatory Visit: Payer: Self-pay | Admitting: Rheumatology

## 2022-06-20 DIAGNOSIS — M352 Behcet's disease: Secondary | ICD-10-CM

## 2022-06-21 NOTE — Telephone Encounter (Signed)
Please schedule patient a follow up visit. Patient due April 2024. Thanks!   Follow-Up Instructions: Return in about 5 months (around 09/05/2022) for Behcet's.

## 2022-06-21 NOTE — Telephone Encounter (Signed)
Next Visit: Due April 2024. Message sent to the front to schedule.   Last Visit: 04/06/2022  Last Fill: 04/12/2022  DX: Behcet's disease   Current Dose per office note 04/06/2022: Mitigare 0.6 mg 1 capsule daily.   Labs: 04/06/2022 CBC and CMP WNL  Okay to refill Mitigare?

## 2022-08-13 ENCOUNTER — Other Ambulatory Visit: Payer: Self-pay | Admitting: Physician Assistant

## 2022-08-13 DIAGNOSIS — M352 Behcet's disease: Secondary | ICD-10-CM

## 2022-08-13 NOTE — Telephone Encounter (Signed)
Next Visit: 09/01/2022  Last Visit: 04/06/2022  Last Fill: 04/05/2022  DX:  Behcet's disease   Current Dose per office note 04/06/2022: Imuran 50 mg 3 tablets by mouth daily   Labs: 04/06/2022 CBC and CMP WNL   Patient to update labs at upcoming appointment on 09/01/2022.   Okay to refill Imuran?

## 2022-08-18 NOTE — Progress Notes (Signed)
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  He has not no recurrence of oral ulcers or genital ulcers.  He denies any episodes of rash, shortness of breath, uveitis or arthritis.  He has been taking Imuran 150 mg p.o. daily.  We cut back his colchicine to 0.6 mg of the other day at the last visit which she has been tolerating well without any flares.  He denies any joint pain.  He works long hours and has to do heavy lifting at work.    Activities of Daily Living:  Patient reports morning stiffness for 0  none .   Patient Denies nocturnal pain.  Difficulty dressing/grooming: Denies Difficulty climbing stairs: Denies Difficulty getting out of chair: Denies Difficulty using hands for taps, buttons, cutlery, and/or writing: Denies  Review of Systems  Constitutional:  Negative for fatigue.  HENT:  Negative for mouth sores and mouth dryness.   Eyes:  Negative for dryness.  Respiratory:  Negative for shortness of breath.   Cardiovascular:  Negative for chest pain and palpitations.  Gastrointestinal:  Negative for blood in stool, constipation and diarrhea.  Endocrine: Negative for increased urination.  Genitourinary:  Negative for involuntary urination.  Musculoskeletal:  Negative for joint pain, gait problem, joint pain, joint swelling, myalgias, muscle weakness, morning stiffness, muscle tenderness and myalgias.  Skin:  Negative for color change, rash, hair loss and sensitivity to sunlight.  Allergic/Immunologic: Negative for susceptible to infections.  Neurological:  Negative for dizziness and headaches.  Hematological:  Negative for swollen glands.  Psychiatric/Behavioral:   Negative for depressed mood and sleep disturbance. The patient is not nervous/anxious.     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   Primary osteoarthritis of both knees 08/15/2018   Lumbar facet arthropathy 08/15/2018   Vitamin D deficiency 07/17/2018   Long term (current) use of systemic steroids 07/17/2018   Chronic right shoulder pain 07/17/2018   Chronic midline low back pain without sciatica 07/17/2018   Oral ulceration 07/08/2018   Pharyngitis    Fever 07/07/2018   Maculopapular rash, generalized 07/07/2018   SIRS (systemic inflammatory response syndrome) 07/07/2018    Past Medical History:  Diagnosis Date   ADHD (attention deficit hyperactivity disorder)    Behcet's disease    Dyslexia     Family History  Problem Relation Age of Onset   Diabetes Mellitus II Father    Thyroid disease Father    Asthma Father    Hypertension Father    Healthy Son    Past Surgical History:  Procedure Laterality Date   HAND SURGERY Right    SKIN BIOPSY  2020   Social History   Social History Narrative   Not on file   Immunization History  Administered Date(s) Administered   Hepatitis B 02/24/2011, 03/29/2011   Influenza-Unspecified 03/04/2018   Moderna Sars-Covid-2 Vaccination 01/21/2020, 02/18/2020   Td 12/28/2015     Objective: Vital Signs: BP 124/77 (BP Location: Left Arm, Patient Position: Sitting, Cuff Size: Normal)   Pulse 78  Resp 14   Ht 6\' 3"  (1.905 m)   Wt 208 lb (94.3 kg)   BMI 26.00 kg/m    Physical Exam Vitals and nursing note reviewed.  Constitutional:      Appearance: He is well-developed.  HENT:     Head: Normocephalic and atraumatic.  Eyes:     Conjunctiva/sclera: Conjunctivae normal.     Pupils: Pupils are equal, round, and reactive to light.  Cardiovascular:     Rate and Rhythm: Normal rate and regular rhythm.     Heart sounds: Normal heart sounds.  Pulmonary:     Effort: Pulmonary  effort is normal.     Breath sounds: Normal breath sounds.  Abdominal:     General: Bowel sounds are normal.     Palpations: Abdomen is soft.  Musculoskeletal:     Cervical back: Normal range of motion and neck supple.  Skin:    General: Skin is warm and dry.     Capillary Refill: Capillary refill takes less than 2 seconds.  Neurological:     Mental Status: He is alert and oriented to person, place, and time.  Psychiatric:        Behavior: Behavior normal.      Musculoskeletal Exam: Cervical, thoracic and lumbar spine were in good range of motion.  Shoulder joints, elbow joints, wrist joints, MCPs PIPs and DIPs were in good range of motion.  He had some calluses on the palmar aspect of his hands.  Hip joints and knee joints were in good range of motion.  There was no tenderness over ankles or MTPs.  CDAI Exam: CDAI Score: -- Patient Global: --; Provider Global: -- Swollen: --; Tender: -- Joint Exam 09/01/2022   No joint exam has been documented for this visit   There is currently no information documented on the homunculus. Go to the Rheumatology activity and complete the homunculus joint exam.  Investigation: No additional findings.  Imaging: No results found.  Recent Labs: Lab Results  Component Value Date   WBC 7.2 04/06/2022   HGB 15.9 04/06/2022   PLT 255 04/06/2022   NA 140 04/06/2022   K 4.4 04/06/2022   CL 103 04/06/2022   CO2 31 04/06/2022   GLUCOSE 77 04/06/2022   BUN 15 04/06/2022   CREATININE 1.07 04/06/2022   BILITOT 0.4 04/06/2022   ALKPHOS 47 07/07/2018   AST 20 04/06/2022   ALT 28 04/06/2022   PROT 7.3 04/06/2022   ALBUMIN 4.0 07/07/2018   CALCIUM 9.3 04/06/2022   GFRAA 118 12/05/2020   QFTBGOLDPLUS NEGATIVE 07/11/2018    Speciality Comments: No specialty comments available.  Procedures:  No procedures performed Allergies: Amoxicillin and Penicillins   Assessment / Plan:     Visit Diagnoses: Behcet's disease - History of oral ulcers,  genital ulcers, rash, arthritis, fever, myalgias, eye inflammation. Prednisone responsive: He has been doing well on Imuran 150 mg p.o. daily.  I will reduce the dose of Mitigare to 1 tablet every other day which she has been tolerating well.  We discontinue Mitigare and use it only on.  Basis at the next visit if he continues to do well.  He had no synovitis on my examination.  There was no conjunctival injection.  He denies any eye discomfort.  There is no history of oral or genital ulcers.  High risk medication use - Imuran 50 mg 3 tablets by mouth daily and Mitigare 0.6 mg 1 capsule by mouth every other day. - Plan: CBC with Differential/Platelet,  COMPLETE METABOLIC PANEL WITH GFR labs obtained on April 06, 2022 CBC and CMP were normal.  Will check labs today and every 3 months to monitor for drug toxicity.  Information on immunization was placed in the AVS.  Primary osteoarthritis of both knees-he denies any discomfort in his knee joints today.  No warmth swelling or effusion was noted.  Lumbar facet arthropathy-he has intermittent discomfort with lifting heavy objects.  He  currently denies discomfort.  Vitamin D deficiency-he takes vitamin D 50,000 units once a month.  Orders: Orders Placed This Encounter  Procedures   CBC with Differential/Platelet   COMPLETE METABOLIC PANEL WITH GFR   No orders of the defined types were placed in this encounter.  Follow-Up Instructions: Return in about 5 months (around 02/01/2023) for behcets.   Bo Merino, MD  Note - This record has been created using Editor, commissioning.  Chart creation errors have been sought, but may not always  have been located. Such creation errors do not reflect on  the standard of medical care.

## 2022-09-01 ENCOUNTER — Encounter: Payer: Self-pay | Admitting: Rheumatology

## 2022-09-01 ENCOUNTER — Ambulatory Visit: Payer: 59 | Attending: Rheumatology | Admitting: Rheumatology

## 2022-09-01 VITALS — BP 124/77 | HR 78 | Resp 14 | Ht 75.0 in | Wt 208.0 lb

## 2022-09-01 DIAGNOSIS — M47816 Spondylosis without myelopathy or radiculopathy, lumbar region: Secondary | ICD-10-CM | POA: Diagnosis not present

## 2022-09-01 DIAGNOSIS — M352 Behcet's disease: Secondary | ICD-10-CM

## 2022-09-01 DIAGNOSIS — Z79899 Other long term (current) drug therapy: Secondary | ICD-10-CM | POA: Diagnosis not present

## 2022-09-01 DIAGNOSIS — M25571 Pain in right ankle and joints of right foot: Secondary | ICD-10-CM

## 2022-09-01 DIAGNOSIS — R252 Cramp and spasm: Secondary | ICD-10-CM

## 2022-09-01 DIAGNOSIS — M170 Bilateral primary osteoarthritis of knee: Secondary | ICD-10-CM | POA: Diagnosis not present

## 2022-09-01 DIAGNOSIS — R21 Rash and other nonspecific skin eruption: Secondary | ICD-10-CM

## 2022-09-01 DIAGNOSIS — E559 Vitamin D deficiency, unspecified: Secondary | ICD-10-CM

## 2022-09-01 NOTE — Patient Instructions (Signed)
Standing Labs We placed an order today for your standing lab work.   Please have your standing labs drawn in July and every 3 months  Please have your labs drawn 2 weeks prior to your appointment so that the provider can discuss your lab results at your appointment, if possible.  Please note that you may see your imaging and lab results in [place] before we have reviewed them. We will contact you once all results are reviewed. Please allow our office up to 72 hours to thoroughly review all of the results before contacting the office for clarification of your results.  WALK-IN LAB HOURS  Monday through Thursday from 8:00 am -12:30 pm and 1:00 pm-5:00 pm and Friday from 8:00 am-12:00 pm.  Patients with office visits requiring labs will be seen before walk-in labs.  You may encounter longer than normal wait times. Please allow additional time. Wait times may be shorter on  Monday and Thursday afternoons.  We do not book appointments for walk-in labs. We appreciate your patience and understanding with our staff.   Labs are drawn by [facility]. Please bring your co-pay at the time of your lab draw.  You may receive a bill from [facility] for your lab work.  Please note if you are on Hydroxychloroquine and and an order has been placed for a Hydroxychloroquine level,  you will need to have it drawn 4 hours or more after your last dose.  If you wish to have your labs drawn at another location, please call the office 24 hours in advance so we can fax the orders.  The office is located at [address], [place], [place], [place] [address]   If you have any questions regarding directions or hours of operation,  please call [phone].   As a reminder, please drink plenty of water prior to coming for your lab work. Thanks!   Vaccines You are taking a medication(s) that can suppress your immune system.  The following immunizations are recommended: Flu annually Covid-19  Td/Tdap (tetanus, diphtheria,  pertussis) every 10 years Pneumonia (Prevnar 15 then Pneumovax 23 at least 1 year apart.  Alternatively, can take Prevnar 20 without needing additional dose) Shingrix: 2 doses from 4 weeks to 6 months apart  Please check with your PCP to make sure you are up to date.

## 2022-09-02 LAB — CBC WITH DIFFERENTIAL/PLATELET
Absolute Monocytes: 900 cells/uL (ref 200–950)
Basophils Absolute: 63 cells/uL (ref 0–200)
Basophils Relative: 0.7 %
Eosinophils Absolute: 108 cells/uL (ref 15–500)
Eosinophils Relative: 1.2 %
HCT: 42.7 % (ref 38.5–50.0)
Hemoglobin: 15.2 g/dL (ref 13.2–17.1)
Lymphs Abs: 1701 cells/uL (ref 850–3900)
MCH: 31.3 pg (ref 27.0–33.0)
MCHC: 35.6 g/dL (ref 32.0–36.0)
MCV: 87.9 fL (ref 80.0–100.0)
MPV: 9.6 fL (ref 7.5–12.5)
Monocytes Relative: 10 %
Neutro Abs: 6228 cells/uL (ref 1500–7800)
Neutrophils Relative %: 69.2 %
Platelets: 314 10*3/uL (ref 140–400)
RBC: 4.86 10*6/uL (ref 4.20–5.80)
RDW: 12.7 % (ref 11.0–15.0)
Total Lymphocyte: 18.9 %
WBC: 9 10*3/uL (ref 3.8–10.8)

## 2022-09-02 LAB — COMPLETE METABOLIC PANEL WITH GFR
AG Ratio: 1.6 (calc) (ref 1.0–2.5)
ALT: 22 U/L (ref 9–46)
AST: 20 U/L (ref 10–40)
Albumin: 4.6 g/dL (ref 3.6–5.1)
Alkaline phosphatase (APISO): 63 U/L (ref 36–130)
BUN: 13 mg/dL (ref 7–25)
CO2: 25 mmol/L (ref 20–32)
Calcium: 9.1 mg/dL (ref 8.6–10.3)
Chloride: 105 mmol/L (ref 98–110)
Creat: 1.03 mg/dL (ref 0.60–1.26)
Globulin: 2.8 g/dL (calc) (ref 1.9–3.7)
Glucose, Bld: 94 mg/dL (ref 65–99)
Potassium: 4 mmol/L (ref 3.5–5.3)
Sodium: 140 mmol/L (ref 135–146)
Total Bilirubin: 0.4 mg/dL (ref 0.2–1.2)
Total Protein: 7.4 g/dL (ref 6.1–8.1)
eGFR: 100 mL/min/{1.73_m2} (ref >=60)

## 2022-09-02 NOTE — Progress Notes (Signed)
CBC and CMP are normal.

## 2022-10-22 ENCOUNTER — Other Ambulatory Visit: Payer: Self-pay | Admitting: Physician Assistant

## 2022-10-22 DIAGNOSIS — M352 Behcet's disease: Secondary | ICD-10-CM

## 2022-10-22 NOTE — Telephone Encounter (Signed)
Please schedule patient a follow up visit. Patient due 02/01/2023. Thanks!

## 2022-10-22 NOTE — Telephone Encounter (Signed)
Last Fill: 08/13/2022  Labs: 09/01/2022 CBC and CMP are normal.   Next Visit: Due 02/01/2023. Message sent to the front to schedule.   Last Visit: 09/01/2022   DX: Behcet's disease   Current Dose per office note 09/01/2022: Imuran 50 mg 3 tablets by mouth daily   Okay to refill Imuran?

## 2022-11-23 ENCOUNTER — Other Ambulatory Visit: Payer: Self-pay | Admitting: *Deleted

## 2022-11-23 NOTE — Telephone Encounter (Signed)
Patient's wife contacted the office and states patient needs prescription for generic Colchicine tablets to be sent to the [facility]   Last Fill: 06/21/2022  Labs: 10/06/2022 CBC WNL 09/01/2022 CBC and CMP are normal.   Next Visit: 02/07/2023  Last Visit: 09/01/2022  DX: Behcet's disease   Current Dose per office note 09/01/2022: Mitigare 0.6 mg 1 capsule by mouth every other day   Okay to refill Colchicine?

## 2022-11-24 MED ORDER — COLCHICINE 0.6 MG PO TABS
0.6000 mg | ORAL_TABLET | ORAL | 0 refills | Status: DC
Start: 2022-11-24 — End: 2023-11-03

## 2022-12-14 ENCOUNTER — Other Ambulatory Visit: Payer: Self-pay

## 2022-12-28 ENCOUNTER — Other Ambulatory Visit (HOSPITAL_COMMUNITY): Payer: Self-pay

## 2023-01-25 NOTE — Progress Notes (Deleted)
Office Visit Note  Patient: Cristian Weiss             Date of Birth: [birth year]           MRN: [id number]             PCP: Katherine Basset, PA-C Referring: Katherine Basset, PA-C Visit Date: 02/07/2023 Occupation: @GUAROCC @  Subjective:  No chief complaint on file.   History of Present Illness: Cristian Weiss is a 32 y.o. male ***     Activities of Daily Living:  Patient reports morning stiffness for *** {minute/hour:19697}.   Patient {ACTIONS;DENIES/REPORTS:21021675::"Denies"} nocturnal pain.  Difficulty dressing/grooming: {ACTIONS;DENIES/REPORTS:21021675::"Denies"} Difficulty climbing stairs: {ACTIONS;DENIES/REPORTS:21021675::"Denies"} Difficulty getting out of chair: {ACTIONS;DENIES/REPORTS:21021675::"Denies"} Difficulty using hands for taps, buttons, cutlery, and/or writing: {ACTIONS;DENIES/REPORTS:21021675::"Denies"}  No Rheumatology ROS completed.   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   Primary osteoarthritis of both knees 08/15/2018   Lumbar facet arthropathy 08/15/2018   Vitamin D deficiency 07/17/2018   Long term (current) use of systemic steroids 07/17/2018   Chronic right shoulder pain 07/17/2018   Chronic midline low back pain without sciatica 07/17/2018   Oral ulceration 07/08/2018   Pharyngitis    Fever 07/07/2018   Maculopapular rash, generalized 07/07/2018   SIRS (systemic inflammatory response syndrome) (HCC) 07/07/2018    Past Medical History:  Diagnosis Date   ADHD (attention deficit hyperactivity disorder)    Behcet's disease (HCC)    Dyslexia     Family History  Problem Relation Age of Onset   Diabetes Mellitus II Father    Thyroid disease Father    Asthma Father    Hypertension Father    Healthy Son    Past Surgical History:  Procedure Laterality Date   HAND SURGERY Right    SKIN BIOPSY  2020   Social History   Social History Narrative   Not on file    Immunization History  Administered Date(s) Administered   Hepatitis B 02/24/2011, 03/29/2011   Influenza-Unspecified 03/04/2018   Moderna Sars-Covid-2 Vaccination 01/21/2020, 02/18/2020   Td 12/28/2015     Objective: Vital Signs: There were no vitals taken for this visit.   Physical Exam   Musculoskeletal Exam: ***  CDAI Exam: CDAI Score: -- Patient Global: --; Provider Global: -- Swollen: --; Tender: -- Joint Exam 02/07/2023   No joint exam has been documented for this visit   There is currently no information documented on the homunculus. Go to the Rheumatology activity and complete the homunculus joint exam.  Investigation: No additional findings.  Imaging: No results found.  Recent Labs: Lab Results  Component Value Date   WBC 9.0 09/01/2022   HGB 15.2 09/01/2022   PLT 314 09/01/2022   NA 140 09/01/2022   K 4.0 09/01/2022   CL 105 09/01/2022   CO2 25 09/01/2022   GLUCOSE 94 09/01/2022   BUN 13 09/01/2022   CREATININE 1.03 09/01/2022   BILITOT 0.4 09/01/2022   ALKPHOS 47 07/07/2018   AST 20 09/01/2022   ALT 22 09/01/2022   PROT 7.4 09/01/2022   ALBUMIN 4.0 07/07/2018   CALCIUM 9.1 09/01/2022   GFRAA 118 12/05/2020   QFTBGOLDPLUS NEGATIVE 07/11/2018    Speciality Comments: No specialty comments available.  Procedures:  No procedures performed Allergies: Amoxicillin and Penicillins   Assessment / Plan:     Visit Diagnoses: Behcet's disease (HCC)  High risk medication use  Primary osteoarthritis of both knees  Lumbar facet arthropathy  Vitamin  D deficiency  Orders: No orders of the defined types were placed in this encounter.  No orders of the defined types were placed in this encounter.   Face-to-face time spent with patient was *** minutes. Greater than 50% of time was spent in counseling and coordination of care.  Follow-Up Instructions: No follow-ups on file.   Gearldine Bienenstock, PA-C  Note - This record has been created using  Dragon software.  Chart creation errors have been sought, but may not always  have been located. Such creation errors do not reflect on  the standard of medical care.

## 2023-02-07 ENCOUNTER — Ambulatory Visit: Payer: 59 | Admitting: Physician Assistant

## 2023-02-07 DIAGNOSIS — M47816 Spondylosis without myelopathy or radiculopathy, lumbar region: Secondary | ICD-10-CM

## 2023-02-07 DIAGNOSIS — M352 Behcet's disease: Secondary | ICD-10-CM

## 2023-02-07 DIAGNOSIS — M170 Bilateral primary osteoarthritis of knee: Secondary | ICD-10-CM

## 2023-02-07 DIAGNOSIS — Z79899 Other long term (current) drug therapy: Secondary | ICD-10-CM

## 2023-02-07 DIAGNOSIS — E559 Vitamin D deficiency, unspecified: Secondary | ICD-10-CM

## 2023-02-23 NOTE — Progress Notes (Signed)
Office Visit Note  Patient: Cristian Weiss             Date of Birth: 03/29/1991           MRN: [id number]             PCP: Katherine Basset, PA-C Referring: Katherine Basset, PA-C Visit Date: 03/09/2023 Occupation: @GUAROCC @  Subjective:  Medication monitoring   History of Present Illness: Cristian Weiss is a 32 y.o. male with history of behcet's disease.  Patient remains on imuran 50 mg 3 tablets daily and mitigare 0.6 mg 1 capsule every other day.  He is tolerating combination therapy without any side effects.  He has not had any interruptions in therapy recently.  Patient denies any recent rashes or oral or genital ulcerations.  He has not had any eye pain, photophobia, or eye redness.  He has intermittent discomfort in his lower back and both knees.  He denies any joint swelling.  He has been experiencing intermittent discomfort at the base of his left heel especially after wearing crocs.  He has tried applying ice for pain relief.  He denies any other joint pain or joint swelling.  He denies any recent or recurrent infections.  He was bit by a dog last week on his right forearm.  He has been trying to clean it daily and applying triple antibiotic ointment.  He has not noticed any signs of infection.     Activities of Daily Living:  Patient reports morning stiffness for 0  none .   Patient Denies nocturnal pain.  Difficulty dressing/grooming: Denies Difficulty climbing stairs: Denies Difficulty getting out of chair: Denies Difficulty using hands for taps, buttons, cutlery, and/or writing: Denies  Review of Systems  Constitutional:  Negative for fatigue.  HENT:  Negative for mouth sores and mouth dryness.   Eyes:  Negative for dryness.  Respiratory:  Negative for shortness of breath.   Cardiovascular:  Negative for chest pain and palpitations.  Gastrointestinal:  Negative for blood in stool, constipation and diarrhea.  Endocrine: Negative for increased urination.   Genitourinary:  Negative for involuntary urination.  Musculoskeletal:  Negative for joint pain, gait problem, joint pain, joint swelling, myalgias, muscle weakness, morning stiffness, muscle tenderness and myalgias.  Skin:  Negative for color change, rash, hair loss and sensitivity to sunlight.  Allergic/Immunologic: Negative for susceptible to infections.  Neurological:  Negative for dizziness and headaches.  Hematological:  Negative for swollen glands.  Psychiatric/Behavioral:  Negative for depressed mood and sleep disturbance. The patient is not nervous/anxious.     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   Primary osteoarthritis of both knees 08/15/2018   Lumbar facet arthropathy 08/15/2018   Vitamin D deficiency 07/17/2018   Long term (current) use of systemic steroids 07/17/2018   Chronic right shoulder pain 07/17/2018   Chronic midline low back pain without sciatica 07/17/2018   Oral ulceration 07/08/2018   Pharyngitis    Fever 07/07/2018   Maculopapular rash, generalized 07/07/2018   SIRS (systemic inflammatory response syndrome) (HCC) 07/07/2018    Past Medical History:  Diagnosis Date   ADHD (attention deficit hyperactivity disorder)    Behcet's disease (HCC)    Dyslexia     Family History  Problem Relation Age of Onset   Diabetes Mellitus II Father    Thyroid disease Father    Asthma Father    Hypertension Father    Healthy Son  Past Surgical History:  Procedure Laterality Date   HAND SURGERY Right    SKIN BIOPSY  2020   Social History   Social History Narrative   Not on file   Immunization History  Administered Date(s) Administered   Hepatitis B 02/24/2011, 03/29/2011   Influenza-Unspecified 03/04/2018   Moderna Sars-Covid-2 Vaccination 01/21/2020, 02/18/2020   Td 12/28/2015     Objective: Vital Signs: BP 124/78 (BP Location: Left Arm, Patient Position: Sitting, Cuff Size: Normal)    Pulse 74   Resp 14   Ht 6\' 3"  (1.905 m)   Wt 203 lb (92.1 kg)   BMI 25.37 kg/m    Physical Exam Vitals and nursing note reviewed.  Constitutional:      Appearance: He is well-developed.  HENT:     Head: Normocephalic and atraumatic.  Eyes:     Conjunctiva/sclera: Conjunctivae normal.     Pupils: Pupils are equal, round, and reactive to light.  Cardiovascular:     Rate and Rhythm: Normal rate and regular rhythm.     Heart sounds: Normal heart sounds.  Pulmonary:     Effort: Pulmonary effort is normal.     Breath sounds: Normal breath sounds.  Abdominal:     General: Bowel sounds are normal.     Palpations: Abdomen is soft.  Musculoskeletal:     Cervical back: Normal range of motion and neck supple.  Skin:    General: Skin is warm and dry.     Capillary Refill: Capillary refill takes less than 2 seconds.  Neurological:     Mental Status: He is alert and oriented to person, place, and time.  Psychiatric:        Behavior: Behavior normal.      Musculoskeletal Exam: C-spine, thoracic spine, and lumbar spine good ROM.  Shoulder joints, elbow joints, wrist joints, MCPs, PIPs, and DIPs good ROM with no synovitis.  Complete fist formation bilaterally.  Hip joints have good ROM with no groin pain.  Knee joints have good ROM with no warmth or effusion.  Ankle joints have good ROM with no joint tenderness.  Tenderness along the plantar fascia of the left heel.  No evidence of achilles tendon.    CDAI Exam: CDAI Score: -- Patient Global: --; Provider Global: -- Swollen: --; Tender: -- Joint Exam 03/09/2023   No joint exam has been documented for this visit   There is currently no information documented on the homunculus. Go to the Rheumatology activity and complete the homunculus joint exam.  Investigation: No additional findings.  Imaging: No results found.  Recent Labs: Lab Results  Component Value Date   WBC 9.0 09/01/2022   HGB 15.2 09/01/2022   PLT 314 09/01/2022    NA 140 09/01/2022   K 4.0 09/01/2022   CL 105 09/01/2022   CO2 25 09/01/2022   GLUCOSE 94 09/01/2022   BUN 13 09/01/2022   CREATININE 1.03 09/01/2022   BILITOT 0.4 09/01/2022   ALKPHOS 47 07/07/2018   AST 20 09/01/2022   ALT 22 09/01/2022   PROT 7.4 09/01/2022   ALBUMIN 4.0 07/07/2018   CALCIUM 9.1 09/01/2022   GFRAA 118 12/05/2020   QFTBGOLDPLUS NEGATIVE 07/11/2018    Speciality Comments: No specialty comments available.  Procedures:  No procedures performed Allergies: Amoxicillin and Penicillins   Assessment / Plan:     Visit Diagnoses: Behcet's disease (HCC) - History of oral ulcers, genital ulcers, rash, arthritis, fever, myalgias, eye inflammation. Prednisone responsive: He has not had any signs or  symptoms of a flare.  He has clinically been doing well taking Imuran 150 mg daily.  Colchicine was reduced to 1 tablet every other day at his last office visit.  He has not noticed any new or worsening symptoms since spacing the dose of colchicine. No conjunctival injection noted on examination today.  He has not had any oral or genital ulcerations.  No recent rashes.  No synovitis noted on examination today. Plan to reduce colchicine to taking on an as-needed basis.  He will remain on Imuran as prescribed. He was advised to notify us if he develops signs or symptoms of a flare.  He will follow-up in the office in 5 months or sooner if needed.  High risk medication use - Imuran 50 mg 3 tablets by mouth daily and Mitigare 0.6 mg 1 capsule by mouth every other day.  CBC and CMP--WNL on 01/13/23. His next lab work is due in November and every 3 months.   Discussed the importance of holding imuran if he develops signs or symptoms of an infection and to resume once the infection has completely cleared.   - Plan: CBC with Differential/Platelet, COMPLETE METABOLIC PANEL WITH GFR  Primary osteoarthritis of both knees: Good ROM of both knee joints noted.  No warmth or effusion noted.    Lumbar facet arthropathy: He experiences intermittent discomfort in his lower back.   Vitamin D deficiency: He is taking vitamin D 50,000 units once monthly.  Orders: Orders Placed This Encounter  Procedures   CBC with Differential/Platelet   COMPLETE METABOLIC PANEL WITH GFR   No orders of the defined types were placed in this encounter.   Follow-Up Instructions: Return in about 5 months (around 08/07/2023) for Behcet's .   Gearldine Bienenstock, PA-C  Note - This record has been created using Dragon software.  Chart creation errors have been sought, but may not always  have been located. Such creation errors do not reflect on  the standard of medical care.

## 2023-03-09 ENCOUNTER — Ambulatory Visit: Payer: 59 | Attending: Physician Assistant | Admitting: Physician Assistant

## 2023-03-09 ENCOUNTER — Encounter: Payer: Self-pay | Admitting: Physician Assistant

## 2023-03-09 VITALS — BP 124/78 | HR 74 | Resp 14 | Ht 75.0 in | Wt 203.0 lb

## 2023-03-09 DIAGNOSIS — M47816 Spondylosis without myelopathy or radiculopathy, lumbar region: Secondary | ICD-10-CM | POA: Diagnosis not present

## 2023-03-09 DIAGNOSIS — M352 Behcet's disease: Secondary | ICD-10-CM

## 2023-03-09 DIAGNOSIS — Z79899 Other long term (current) drug therapy: Secondary | ICD-10-CM

## 2023-03-09 DIAGNOSIS — M170 Bilateral primary osteoarthritis of knee: Secondary | ICD-10-CM | POA: Diagnosis not present

## 2023-03-09 DIAGNOSIS — E559 Vitamin D deficiency, unspecified: Secondary | ICD-10-CM

## 2023-03-09 NOTE — Patient Instructions (Signed)
Standing Labs We placed an order today for your standing lab work.   Please have your standing labs drawn in Mid-November and every 3 months   Please have your labs drawn 2 weeks prior to your appointment so that the provider can discuss your lab results at your appointment, if possible.  Please note that you may see your imaging and lab results in MyChart before we have reviewed them. We will contact you once all results are reviewed. Please allow our office up to 72 hours to thoroughly review all of the results before contacting the office for clarification of your results.  WALK-IN LAB HOURS  Monday through Thursday from 8:00 am -12:30 pm and 1:00 pm-5:00 pm and Friday from 8:00 am-12:00 pm.  Patients with office visits requiring labs will be seen before walk-in labs.  You may encounter longer than normal wait times. Please allow additional time. Wait times may be shorter on  Monday and Thursday afternoons.  We do not book appointments for walk-in labs. We appreciate your patience and understanding with our staff.   Labs are drawn by [facility]. Please bring your co-pay at the time of your lab draw.  You may receive a bill from [facility] for your lab work.  Please note if you are on Hydroxychloroquine and and an order has been placed for a Hydroxychloroquine level,  you will need to have it drawn 4 hours or more after your last dose.  If you wish to have your labs drawn at another location, please call the office 24 hours in advance so we can fax the orders.  The office is located at [address], [place], [place] [address]   If you have any questions regarding directions or hours of operation,  please call [phone].   As a reminder, please drink plenty of water prior to coming for your lab work. Thanks!

## 2023-05-19 ENCOUNTER — Other Ambulatory Visit (HOSPITAL_COMMUNITY): Payer: Self-pay

## 2023-06-22 ENCOUNTER — Other Ambulatory Visit (HOSPITAL_COMMUNITY): Payer: Self-pay

## 2023-06-22 MED ORDER — MELOXICAM 7.5 MG PO TABS
7.5000 mg | ORAL_TABLET | Freq: Two times a day (BID) | ORAL | 0 refills | Status: DC
  Filled 2023-06-22: qty 60, 30d supply, fill #0

## 2023-07-28 NOTE — Progress Notes (Signed)
 Office Visit Note  Patient: Cristian Weiss             Date of Birth: Feb 04, 1991           MRN: [id number]             PCP: Katherine Basset, PA-C Referring: Katherine Basset, PA-C Visit Date: 08/10/2023 Occupation: @GUAROCC @  Subjective:  Medication management  History of Present Illness: Cristian Weiss is a 33 y.o. male with patient's disease.  He denies having flares of patients.  He denies any history of recent rash or ulcerations.  He denies any recent episodes of eye inflammation.  He denies any shortness of breath.  He states the lower back discomfort and knee joint discomfort is manageable.  He has not had any recent problems with heel pain.  He still drives long distance.    Activities of Daily Living:  Patient reports morning stiffness for 0  none .   Patient Denies nocturnal pain.  Difficulty dressing/grooming: Denies Difficulty climbing stairs: Denies Difficulty getting out of chair: Denies Difficulty using hands for taps, buttons, cutlery, and/or writing: Denies  Review of Systems  Constitutional:  Negative for fatigue.  HENT:  Negative for mouth sores and mouth dryness.   Eyes:  Negative for dryness.  Respiratory:  Negative for shortness of breath.   Cardiovascular:  Negative for chest pain and palpitations.  Gastrointestinal:  Negative for blood in stool, constipation and diarrhea.  Endocrine: Negative for increased urination.  Genitourinary:  Negative for involuntary urination.  Musculoskeletal:  Negative for joint pain, gait problem, joint pain, joint swelling, myalgias, muscle weakness, morning stiffness, muscle tenderness and myalgias.  Skin:  Negative for color change, rash, hair loss and sensitivity to sunlight.  Allergic/Immunologic: Negative for susceptible to infections.  Neurological:  Negative for dizziness and headaches.  Hematological:  Negative for swollen glands.  Psychiatric/Behavioral:  Negative for depressed mood and sleep disturbance. The  patient is not nervous/anxious.     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   Primary osteoarthritis of both knees 08/15/2018   Lumbar facet arthropathy 08/15/2018   Vitamin D deficiency 07/17/2018   Long term (current) use of systemic steroids 07/17/2018   Chronic right shoulder pain 07/17/2018   Chronic midline low back pain without sciatica 07/17/2018   Oral ulceration 07/08/2018   Pharyngitis    Fever 07/07/2018   Maculopapular rash, generalized 07/07/2018   SIRS (systemic inflammatory response syndrome) (HCC) 07/07/2018    Past Medical History:  Diagnosis Date   ADHD (attention deficit hyperactivity disorder)    Behcet's disease (HCC)    Dyslexia     Family History  Problem Relation Age of Onset   Diabetes Mellitus II Father    Thyroid disease Father    Asthma Father    Hypertension Father    Healthy Son    Past Surgical History:  Procedure Laterality Date   HAND SURGERY Right    SKIN BIOPSY  2020   Social History   Social History Narrative   Not on file   Immunization History  Administered Date(s) Administered   Hepatitis B 02/24/2011, 03/29/2011   Influenza-Unspecified 03/04/2018   Moderna Sars-Covid-2 Vaccination 01/21/2020, 02/18/2020   Td 12/28/2015     Objective: Vital Signs: BP 108/75 (BP Location: Left Arm, Patient Position: Sitting, Cuff Size: Normal)   Pulse 92   Resp 14   Ht 6\' 3"  (1.905 m)   Wt 212 lb (  96.2 kg)   BMI 26.50 kg/m    Physical Exam Vitals and nursing note reviewed.  Constitutional:      Appearance: He is well-developed.  HENT:     Head: Normocephalic and atraumatic.  Eyes:     Conjunctiva/sclera: Conjunctivae normal.     Pupils: Pupils are equal, round, and reactive to light.  Cardiovascular:     Rate and Rhythm: Normal rate and regular rhythm.     Heart sounds: Normal heart sounds.  Pulmonary:     Effort: Pulmonary effort is normal.     Breath sounds:  Normal breath sounds.  Abdominal:     General: Bowel sounds are normal.     Palpations: Abdomen is soft.  Musculoskeletal:     Cervical back: Normal range of motion and neck supple.  Skin:    General: Skin is warm and dry.     Capillary Refill: Capillary refill takes less than 2 seconds.  Neurological:     Mental Status: He is alert and oriented to person, place, and time.  Psychiatric:        Behavior: Behavior normal.      Musculoskeletal Exam: Cervical, thoracic and lumbar spine 1 good range of motion.  Shoulders, elbows, wrists, MCPs PIPs and DIPs with good range of motion with no synovitis.  Hip joints and knee joints in good range of motion without any warmth swelling or effusion.  There was no tenderness over ankles or MTPs.  There was no Achilles tendinitis or plantar fasciitis.  CDAI Exam: CDAI Score: -- Patient Global: --; Provider Global: -- Swollen: --; Tender: -- Joint Exam 08/10/2023   No joint exam has been documented for this visit   There is currently no information documented on the homunculus. Go to the Rheumatology activity and complete the homunculus joint exam.  Investigation: No additional findings.  Imaging: No results found.  Recent Labs: Lab Results  Component Value Date   WBC 9.0 09/01/2022   HGB 15.2 09/01/2022   PLT 314 09/01/2022   NA 140 09/01/2022   K 4.0 09/01/2022   CL 105 09/01/2022   CO2 25 09/01/2022   GLUCOSE 94 09/01/2022   BUN 13 09/01/2022   CREATININE 1.03 09/01/2022   BILITOT 0.4 09/01/2022   ALKPHOS 47 07/07/2018   AST 20 09/01/2022   ALT 22 09/01/2022   PROT 7.4 09/01/2022   ALBUMIN 4.0 07/07/2018   CALCIUM 9.1 09/01/2022   GFRAA 118 12/05/2020   QFTBGOLDPLUS NEGATIVE 07/11/2018     Speciality Comments: No specialty comments available.  Procedures:  No procedures performed Allergies: Amoxicillin and Penicillins   Assessment / Plan:     Visit Diagnoses: Behcet's disease (HCC) - History of oral ulcers,  genital ulcers, rash, arthritis, fever, myalgias, eye inflammation.  Patient denies having a flare of patient's.  He has been taking Imuran 150 mg daily without any interruption.  He takes colchicine only on as needed basis.  He did not have to take colchicine in a long time.  He denies any history of oral ulcers, genital ulcers, uveitis, plantar fasciitis or Achilles tendinitis.  There is no history of shortness of breath.  We discussed decreasing the dose of Imuran to 50 mg, 2-1/2 tablets daily for the next 3 months.  If he does well we will reduce the dose of Imuran 50 mg, 2 tablets daily.  I advised him to contact us if he develops any increased symptoms.  High risk medication use - Imuran 50 mg 3 tablets  by mouth daily and Mitigare 0.6 mg 1 capsule by mouth every other day as needed.June 22, 2023 CMP normal, CBC normal.  He was advised to get labs every 3 months.  Information imagination was placed in the AVS.  He was advised to hold Imuran if he develops an infection and resume after the infection resolves.  Primary osteoarthritis of both knees-he has not had much discomfort in his knee joints.  He drives long distance.  Increased risk of DVTs with autoimmune disease was discussed.  Increase water intake and frequent stretching was advised.  A handout on lower extremity exercises was given.  Lumbar facet arthropathy-he has not had any recent problems with lower back pain.  A handout on lower back stretches was given.  Vitamin D deficiency - vitamin D 50,000 units once monthly  Orders: No orders of the defined types were placed in this encounter.  No orders of the defined types were placed in this encounter.    Follow-Up Instructions: Return in about 6 months (around 02/10/2024) for Behcets.   Pollyann Savoy, MD  Note - This record has been created using Animal nutritionist.  Chart creation errors have been sought, but may not always  have been located. Such creation errors do not  reflect on  the standard of medical care.

## 2023-08-10 ENCOUNTER — Encounter: Payer: Self-pay | Admitting: Rheumatology

## 2023-08-10 ENCOUNTER — Ambulatory Visit: Payer: 59 | Attending: Rheumatology | Admitting: Rheumatology

## 2023-08-10 VITALS — BP 108/75 | HR 92 | Resp 14 | Ht 75.0 in | Wt 212.0 lb

## 2023-08-10 DIAGNOSIS — Z79899 Other long term (current) drug therapy: Secondary | ICD-10-CM

## 2023-08-10 DIAGNOSIS — M47816 Spondylosis without myelopathy or radiculopathy, lumbar region: Secondary | ICD-10-CM

## 2023-08-10 DIAGNOSIS — M352 Behcet's disease: Secondary | ICD-10-CM | POA: Diagnosis not present

## 2023-08-10 DIAGNOSIS — M170 Bilateral primary osteoarthritis of knee: Secondary | ICD-10-CM

## 2023-08-10 DIAGNOSIS — E559 Vitamin D deficiency, unspecified: Secondary | ICD-10-CM

## 2023-08-10 NOTE — Patient Instructions (Addendum)
 Decrease Imuran 50 mg tablet, 2-1/2 tablets daily  You may take colchicine 0.6 mg tablet by mouth as needed.  Standing Labs We placed an order today for your standing lab work.   Please have your standing labs drawn in April and every 3 months  Please have your labs drawn 2 weeks prior to your appointment so that the provider can discuss your lab results at your appointment, if possible.  Please note that you may see your imaging and lab results in MyChart before we have reviewed them. We will contact you once all results are reviewed. Please allow our office up to 72 hours to thoroughly review all of the results before contacting the office for clarification of your results.  WALK-IN LAB HOURS  Monday through Thursday from 8:00 am -12:30 pm and 1:00 pm-5:00 pm and Friday from 8:00 am-12:00 pm.  Patients with office visits requiring labs will be seen before walk-in labs.  You may encounter longer than normal wait times. Please allow additional time. Wait times may be shorter on  Monday and Thursday afternoons.  We do not book appointments for walk-in labs. We appreciate your patience and understanding with our staff.   Labs are drawn by [facility]. Please bring your co-pay at the time of your lab draw.  You may receive a bill from [facility] for your lab work.  Please note if you are on Hydroxychloroquine and and an order has been placed for a Hydroxychloroquine level,  you will need to have it drawn 4 hours or more after your last dose.  If you wish to have your labs drawn at another location, please call the office 24 hours in advance so we can fax the orders.  The office is located at [address], [place], [place] [address]   If you have any questions regarding directions or hours of operation,  please call [phone].   As a reminder, please drink plenty of water prior to coming for your lab work. Thanks!   Vaccines You are taking a medication(s) that can suppress your  immune system.  The following immunizations are recommended: Flu annually Covid-19  RSV Td/Tdap (tetanus, diphtheria, pertussis) every 10 years Pneumonia (Prevnar 15 then Pneumovax 23 at least 1 year apart.  Alternatively, can take Prevnar 20 without needing additional dose) Shingrix: 2 doses from 4 weeks to 6 months apart  Please check with your PCP to make sure you are up to date.   If you have signs or symptoms of an infection or start antibiotics: First, call your PCP for workup of your infection. Hold your medication through the infection, until you complete your antibiotics, and until symptoms resolve if you take the following: Injectable medication (Actemra, Benlysta, Cimzia, Cosentyx, Enbrel, Humira, Kevzara, Orencia, Remicade, Simponi, Stelara, Taltz, Tremfya) Methotrexate Leflunomide (Arava) Mycophenolate (Cellcept) Osborne [place], or Rinvoq  Exercises for Chronic Knee Pain Chronic knee pain is pain that lasts longer than 3 months. For most people with chronic knee pain, exercise and weight loss is an important part of treatment. Your health care provider may want you to focus on: Making the muscles that support your knee stronger. This can take pressure off your knee and reduce pain. Preventing knee stiffness. How far you can move your knee, keeping it there or making it farther. Losing weight (if this applies) to take pressure off your knee, lower your risk for injury, and make it easier for you to exercise. Your provider will help you make an exercise program  that fits your needs and physical abilities. Below are simple, low-impact exercises you can do at home. Ask your provider or physical therapist how often you should do your exercise program and how many times to repeat each exercise. General safety tips  Get your provider's approval before doing any exercises. Start slowly and stop any time you feel pain. Do not exercise if your knee pain is flaring up. Warm up  first. Stretching a cold muscle can cause an injury. Do 5-10 minutes of easy movement or light stretching before beginning your exercises. Do 5-10 minutes of low-impact activity (like walking or cycling) before starting strengthening exercises. Contact your provider any time you have pain during or after exercising. Exercise can cause discomfort but should not be painful. It is normal to be a little stiff or sore after exercising. Stretching and range-of-motion exercises Front thigh stretch  Stand up straight and support your body by holding on to a chair or resting one hand on a wall. With your legs straight and close together, bend one knee to lift your heel up toward your butt. Using one hand for support, grab your ankle with your free hand. Pull your foot up closer toward your butt to feel the stretch in front of your thigh. Hold the stretch for 30 seconds. Repeat __________ times. Complete this exercise __________ times a day. Back thigh stretch  Sit on the floor with your back straight and your legs out straight in front of you. Place the palms of your hands on the floor and slide them toward your feet as you bend at the hip. Try to touch your nose to your knees and feel the stretch in the back of your thighs. Hold for 30 seconds. Repeat __________ times. Complete this exercise __________ times a day. Calf stretch  Stand facing a wall. Place the palms of your hands flat against the wall, arms extended, and lean slightly against the wall. Get into a lunge position with one leg bent at the knee and the other leg stretched out straight behind you. Keep both feet facing the wall and increase the bend in your knee while keeping the heel of the other leg flat on the ground. You should feel the stretch in your calf. Hold for 30 seconds. Repeat __________ times. Complete this exercise __________ times a day. Strengthening exercises Straight leg lift  Lie on your back with one knee bent  and the other leg out straight. Slowly lift the straight leg without bending the knee. Lift until your foot is about 12 inches (30 cm) off the floor. Hold for 3-5 seconds and slowly lower your leg. Repeat __________ times. Complete this exercise __________ times a day. Single leg dip  Stand between two chairs and put both hands on the backs of the chairs for support. Extend one leg out straight with your body weight resting on the heel of the standing leg. Slowly bend your standing knee to dip your body to the level that is comfortable for you. Hold for 3-5 seconds. Repeat __________ times. Complete this exercise __________ times a day. Hamstring curls  Stand straight, knees close together, facing the back of a chair. Hold on to the back of a chair with both hands. Keep one leg straight. Bend the other knee while bringing the heel up toward the butt until the knee is bent at a 90-degree angle (right angle). Hold for 3-5 seconds. Repeat __________ times. Complete this exercise __________ times a day. Wall squat  Stand  straight with your back, hips, and head against a wall. Step forward one foot at a time with your back still against the wall. Your feet should be 2 feet (61 cm) from the wall at shoulder width. Keeping your back, hips, and head against the wall, slide down the wall to as close to a sitting position as you can get. Hold for 5-10 seconds, then slowly slide back up. Repeat __________ times. Complete this exercise __________ times a day. Step-ups  Stand in front of a sturdy platform or stool that is about 6 inches (15 cm) high. Slowly step up with your left / right foot, keeping your knee in line with your hip and foot. Do not let your knee bend so far that you cannot see your toes. Hold on to a chair for balance, but do not use it for support. Slowly unlock your knee and lower yourself to the starting position. Repeat __________ times. Complete this exercise __________ times  a day. Contact a health care provider if: Your exercises cause pain. Your pain is worse after you exercise. Your pain prevents you from doing your exercises. This information is not intended to replace advice given to you by your health care provider. Make sure you discuss any questions you have with your health care provider. Document Revised: 06/01/2022 Document Reviewed: 06/01/2022 Elsevier Patient Education  2024 [employer]  Low Back Sprain or Strain Rehab Ask your health care provider which exercises are safe for you. Do exercises exactly as told by your health care provider and adjust them as directed. It is normal to feel mild stretching, pulling, tightness, or discomfort as you do these exercises. Stop right away if you feel sudden pain or your pain gets worse. Do not begin these exercises until told by your health care provider. Stretching and range-of-motion exercises These exercises warm up your muscles and joints and improve the movement and flexibility of your back. These exercises also help to relieve pain, numbness, and tingling. Lumbar rotation  Lie on your back on a firm bed or the floor with your knees bent. Straighten your arms out to your sides so each arm forms a 90-degree angle (right angle) with a side of your body. Slowly move (rotate) both of your knees to one side of your body until you feel a stretch in your lower back (lumbar). Try not to let your shoulders lift off the floor. Hold this position for __________ seconds. Tense your abdominal muscles and slowly move your knees back to the starting position. Repeat this exercise on the other side of your body. Repeat __________ times. Complete this exercise __________ times a day. Single knee to chest  Lie on your back on a firm bed or the floor with both legs straight. Bend one of your knees. Use your hands to move your knee up toward your chest until you feel a gentle stretch in your lower back and  buttock. Hold your leg in this position by holding on to the front of your knee. Keep your other leg as straight as possible. Hold this position for __________ seconds. Slowly return to the starting position. Repeat with your other leg. Repeat __________ times. Complete this exercise __________ times a day. Prone extension on elbows  Lie on your abdomen on a firm bed or the floor (prone position). Prop yourself up on your elbows. Use your arms to help lift your chest up until you feel a gentle stretch in your abdomen and your lower back. This  will place some of your body weight on your elbows. If this is uncomfortable, try stacking pillows under your chest. Your hips should stay down, against the surface that you are lying on. Keep your hip and back muscles relaxed. Hold this position for __________ seconds. Slowly relax your upper body and return to the starting position. Repeat __________ times. Complete this exercise __________ times a day. Strengthening exercises These exercises build strength and endurance in your back. Endurance is the ability to use your muscles for a long time, even after they get tired. Pelvic tilt This exercise strengthens the muscles that lie deep in the abdomen. Lie on your back on a firm bed or the floor with your legs extended. Bend your knees so they are pointing toward the ceiling and your feet are flat on the floor. Tighten your lower abdominal muscles to press your lower back against the floor. This motion will tilt your pelvis so your tailbone points up toward the ceiling instead of pointing to your feet or the floor. To help with this exercise, you may place a small towel under your lower back and try to push your back into the towel. Hold this position for __________ seconds. Let your muscles relax completely before you repeat this exercise. Repeat __________ times. Complete this exercise __________ times a day. Alternating arm and leg raises  Get  on your hands and knees on a firm surface. If you are on a hard floor, you may want to use padding, such as an exercise mat, to cushion your knees. Line up your arms and legs. Your hands should be directly below your shoulders, and your knees should be directly below your hips. Lift your left leg behind you. At the same time, raise your right arm and straighten it in front of you. Do not lift your leg higher than your hip. Do not lift your arm higher than your shoulder. Keep your abdominal and back muscles tight. Keep your hips facing the ground. Do not arch your back. Keep your balance carefully, and do not hold your breath. Hold this position for __________ seconds. Slowly return to the starting position. Repeat with your right leg and your left arm. Repeat __________ times. Complete this exercise __________ times a day. Abdominal set with straight leg raise  Lie on your back on a firm bed or the floor. Bend one of your knees and keep your other leg straight. Tense your abdominal muscles and lift your straight leg up, 4-6 inches (10-15 cm) off the ground. Keep your abdominal muscles tight and hold this position for __________ seconds. Do not hold your breath. Do not arch your back. Keep it flat against the ground. Keep your abdominal muscles tense as you slowly lower your leg back to the starting position. Repeat with your other leg. Repeat __________ times. Complete this exercise __________ times a day. Single leg lower with bent knees Lie on your back on a firm bed or the floor. Tense your abdominal muscles and lift your feet off the floor, one foot at a time, so your knees and hips are bent in 90-degree angles (right angles). Your knees should be over your hips and your lower legs should be parallel to the floor. Keeping your abdominal muscles tense and your knee bent, slowly lower one of your legs so your toe touches the ground. Lift your leg back up to return to the starting  position. Do not hold your breath. Do not let your back arch. Keep your  back flat against the ground. Repeat with your other leg. Repeat __________ times. Complete this exercise __________ times a day. Posture and body mechanics Good posture and healthy body mechanics can help to relieve stress in your body's tissues and joints. Body mechanics refers to the movements and positions of your body while you do your daily activities. Posture is part of body mechanics. Good posture means: Your spine is in its natural S-curve position (neutral). Your shoulders are pulled back slightly. Your head is not tipped forward (neutral). Follow these guidelines to improve your posture and body mechanics in your everyday activities. Standing  When standing, keep your spine neutral and your feet about hip-width apart. Keep a slight bend in your knees. Your ears, shoulders, and hips should line up. When you do a task in which you stand in one place for a long time, place one foot up on a stable object that is 2-4 inches (5-10 cm) high, such as a footstool. This helps keep your spine neutral. Sitting  When sitting, keep your spine neutral and keep your feet flat on the floor. Use a footrest, if necessary, and keep your thighs parallel to the floor. Avoid rounding your shoulders, and avoid tilting your head forward. When working at a desk or a computer, keep your desk at a height where your hands are slightly lower than your elbows. Slide your chair under your desk so you are close enough to maintain good posture. When working at a computer, place your monitor at a height where you are looking straight ahead and you do not have to tilt your head forward or downward to look at the screen. Resting When lying down and resting, avoid positions that are most painful for you. If you have pain with activities such as sitting, bending, stooping, or squatting, lie in a position in which your body does not bend very much. For  example, avoid curling up on your side with your arms and knees near your chest (fetal position). If you have pain with activities such as standing for a long time or reaching with your arms, lie with your spine in a neutral position and bend your knees slightly. Try the following positions: Lying on your side with a pillow between your knees. Lying on your back with a pillow under your knees. Lifting  When lifting objects, keep your feet at least shoulder-width apart and tighten your abdominal muscles. Bend your knees and hips and keep your spine neutral. It is important to lift using the strength of your legs, not your back. Do not lock your knees straight out. Always ask for help to lift heavy or awkward objects. This information is not intended to replace advice given to you by your health care provider. Make sure you discuss any questions you have with your health care provider. Document Revised: 09/20/2022 Document Reviewed: 08/04/2020 Elsevier Patient Education  2024 [employer].

## 2023-08-14 ENCOUNTER — Other Ambulatory Visit: Payer: Self-pay | Admitting: Internal Medicine

## 2023-08-14 DIAGNOSIS — M352 Behcet's disease: Secondary | ICD-10-CM

## 2023-08-15 NOTE — Telephone Encounter (Signed)
 Last Fill: 10/22/2022  Labs: 06/22/2023 normal  Next Visit: 02/15/2024  Last Visit: 08/10/2023  DX: Behcet's disease   Current Dose per office note 08/10/2023: Imuran 50 mg 3 tablets by mouth daily   Okay to refill Imuran?

## 2023-11-02 ENCOUNTER — Emergency Department (HOSPITAL_COMMUNITY): Admission: EM | Admit: 2023-11-02 | Discharge: 2023-11-29 | Disposition: E

## 2023-11-29 NOTE — ED Notes (Signed)
 Family notified by Evlyn Hoffmann and Elmira Haddock.
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
## 2023-11-29 DEATH — deceased

## 2024-02-15 ENCOUNTER — Ambulatory Visit: Admitting: Rheumatology
